# Patient Record
Sex: Female | Born: 1950 | Race: White | Hispanic: No | Marital: Married | Smoking: Former smoker
Health system: Southern US, Community
[De-identification: ages and names within clinical notes are randomized; demographics above are authoritative.]

## PROBLEM LIST (undated history)

## (undated) DIAGNOSIS — I493 Ventricular premature depolarization: Secondary | ICD-10-CM

## (undated) DIAGNOSIS — E785 Hyperlipidemia, unspecified: Secondary | ICD-10-CM

## (undated) DIAGNOSIS — E041 Nontoxic single thyroid nodule: Secondary | ICD-10-CM

## (undated) DIAGNOSIS — K649 Unspecified hemorrhoids: Secondary | ICD-10-CM

## (undated) DIAGNOSIS — I10 Essential (primary) hypertension: Secondary | ICD-10-CM

## (undated) DIAGNOSIS — Z8719 Personal history of other diseases of the digestive system: Secondary | ICD-10-CM

## (undated) DIAGNOSIS — C801 Malignant (primary) neoplasm, unspecified: Secondary | ICD-10-CM

## (undated) DIAGNOSIS — M8580 Other specified disorders of bone density and structure, unspecified site: Secondary | ICD-10-CM

## (undated) DIAGNOSIS — K219 Gastro-esophageal reflux disease without esophagitis: Secondary | ICD-10-CM

## (undated) HISTORY — PX: DILATION AND CURETTAGE OF UTERUS: SHX78

## (undated) HISTORY — PX: CERVICAL BIOPSY: SHX590

## (undated) HISTORY — PX: ESOPHAGOGASTRODUODENOSCOPY: SHX1529

## (undated) HISTORY — PX: SKIN CANCER EXCISION: SHX779

## (undated) HISTORY — PX: COLONOSCOPY: SHX174

## (undated) HISTORY — PX: TONSILLECTOMY: SUR1361

## (undated) HISTORY — PX: ABDOMINAL HYSTERECTOMY: SHX81

---

## 2004-05-31 ENCOUNTER — Ambulatory Visit: Payer: Self-pay | Admitting: Gastroenterology

## 2005-08-30 ENCOUNTER — Other Ambulatory Visit: Payer: Self-pay

## 2005-08-30 ENCOUNTER — Inpatient Hospital Stay: Payer: Self-pay | Admitting: Internal Medicine

## 2005-08-31 ENCOUNTER — Other Ambulatory Visit: Payer: Self-pay

## 2005-09-24 ENCOUNTER — Ambulatory Visit: Payer: Self-pay | Admitting: Gastroenterology

## 2005-09-26 ENCOUNTER — Ambulatory Visit: Payer: Self-pay | Admitting: Gastroenterology

## 2006-04-28 ENCOUNTER — Other Ambulatory Visit: Payer: Self-pay

## 2006-04-28 ENCOUNTER — Emergency Department: Payer: Self-pay | Admitting: Emergency Medicine

## 2007-04-30 ENCOUNTER — Ambulatory Visit: Payer: Self-pay | Admitting: Family Medicine

## 2008-07-23 ENCOUNTER — Ambulatory Visit: Payer: Self-pay | Admitting: Family Medicine

## 2008-09-08 ENCOUNTER — Ambulatory Visit: Payer: Self-pay | Admitting: Unknown Physician Specialty

## 2011-04-25 ENCOUNTER — Ambulatory Visit: Payer: Self-pay | Admitting: Gastroenterology

## 2011-05-26 ENCOUNTER — Ambulatory Visit: Payer: Self-pay | Admitting: Gastroenterology

## 2011-06-15 ENCOUNTER — Ambulatory Visit: Payer: Self-pay

## 2011-07-27 ENCOUNTER — Ambulatory Visit: Payer: Self-pay | Admitting: Internal Medicine

## 2011-07-27 LAB — RAPID INFLUENZA A&B ANTIGENS

## 2013-10-08 ENCOUNTER — Emergency Department: Payer: Self-pay | Admitting: Internal Medicine

## 2013-10-08 LAB — CBC
HCT: 45.5 % (ref 35.0–47.0)
HGB: 15.5 g/dL (ref 12.0–16.0)
MCH: 31.2 pg (ref 26.0–34.0)
MCHC: 34.1 g/dL (ref 32.0–36.0)
MCV: 92 fL (ref 80–100)
PLATELETS: 255 10*3/uL (ref 150–440)
RBC: 4.97 10*6/uL (ref 3.80–5.20)
RDW: 13.5 % (ref 11.5–14.5)
WBC: 7.1 10*3/uL (ref 3.6–11.0)

## 2013-10-08 LAB — COMPREHENSIVE METABOLIC PANEL
ALK PHOS: 97 U/L
ALT: 24 U/L (ref 12–78)
ANION GAP: 4 — AB (ref 7–16)
AST: 23 U/L (ref 15–37)
Albumin: 4.2 g/dL (ref 3.4–5.0)
BUN: 15 mg/dL (ref 7–18)
Bilirubin,Total: 0.4 mg/dL (ref 0.2–1.0)
CALCIUM: 9.2 mg/dL (ref 8.5–10.1)
Chloride: 107 mmol/L (ref 98–107)
Co2: 27 mmol/L (ref 21–32)
Creatinine: 0.75 mg/dL (ref 0.60–1.30)
EGFR (Non-African Amer.): >60
Glucose: 110 mg/dL — ABNORMAL HIGH (ref 65–99)
Osmolality: 277 (ref 275–301)
Potassium: 3.6 mmol/L (ref 3.5–5.1)
Sodium: 138 mmol/L (ref 136–145)
Total Protein: 8.3 g/dL — ABNORMAL HIGH (ref 6.4–8.2)

## 2013-10-08 LAB — URINALYSIS, COMPLETE
Bilirubin,UR: NEGATIVE
Glucose,UR: NEGATIVE mg/dL (ref 0–75)
Ketone: NEGATIVE
LEUKOCYTE ESTERASE: NEGATIVE
NITRITE: NEGATIVE
PROTEIN: NEGATIVE
Ph: 7 (ref 4.5–8.0)
RBC,UR: 1 /HPF (ref 0–5)
SPECIFIC GRAVITY: 1.002 (ref 1.003–1.030)

## 2013-10-08 LAB — MAGNESIUM: Magnesium: 1.9 mg/dL

## 2013-10-08 LAB — TROPONIN I

## 2013-10-08 LAB — TSH: Thyroid Stimulating Horm: 1.72 u[IU]/mL

## 2013-10-28 ENCOUNTER — Ambulatory Visit: Payer: Self-pay | Admitting: Gastroenterology

## 2016-11-22 ENCOUNTER — Ambulatory Visit
Admission: EM | Admit: 2016-11-22 | Discharge: 2016-11-22 | Disposition: A | Discharge status: Home / Self Care | Payer: Managed Care, Other (non HMO) | Attending: Family Medicine | Admitting: Family Medicine

## 2016-11-22 ENCOUNTER — Encounter: Payer: Self-pay | Admitting: Emergency Medicine

## 2016-11-22 DIAGNOSIS — J069 Acute upper respiratory infection, unspecified: Secondary | ICD-10-CM

## 2016-11-22 DIAGNOSIS — J029 Acute pharyngitis, unspecified: Secondary | ICD-10-CM | POA: Diagnosis not present

## 2016-11-22 DIAGNOSIS — J312 Chronic pharyngitis: Secondary | ICD-10-CM

## 2016-11-22 DIAGNOSIS — R0981 Nasal congestion: Secondary | ICD-10-CM

## 2016-11-22 HISTORY — DX: Essential (primary) hypertension: I10

## 2016-11-22 HISTORY — DX: Hyperlipidemia, unspecified: E78.5

## 2016-11-22 LAB — RAPID STREP SCREEN (MED CTR MEBANE ONLY): Streptococcus, Group A Screen (Direct): NEGATIVE

## 2016-11-22 NOTE — ED Provider Notes (Signed)
[facility]    CSN: [id number] Arrival date & time: 11/22/16  1405  History   Chief Complaint Chief Complaint  Patient presents with  . Sore Throat   HPI  66 year old female presents with complaints of ear pain, sore throat, and congestion.  Sore throat and ear pain started yesterday. Moderate in severity. Has prevented her from sleeping well. Congestion just started this morning. No associated fever. No known exacerbating or relieving factors. No medications tried. No other associated symptoms. No other complaints or concerns at this time.  Past Medical History:  Diagnosis Date  . Hyperlipidemia   . Hypertension    There are no active problems to display for this patient.  Past Surgical History:  Procedure Laterality Date  . ABDOMINAL HYSTERECTOMY    . TONSILLECTOMY     OB History    No data available     Home Medications    Prior to Admission medications   Medication Sig Start Date End Date Taking? Authorizing Provider  atorvastatin (LIPITOR) 10 MG tablet Take 10 mg by mouth daily.   Yes [provider]  diltiazem (CARDIZEM) 120 MG tablet Take 120 mg by mouth 4 (four) times daily.   Yes [provider]  lisinopril (PRINIVIL,ZESTRIL) 5 MG tablet Take 5 mg by mouth daily.   Yes [provider]    Family History History reviewed. No pertinent family history.  Social History Social History  Substance Use Topics  . Smoking status: Former Research scientist (life sciences)  . Smokeless tobacco: Never Used  . Alcohol use No   Allergies   Macrobid [nitrofurantoin macrocrystal] and Penicillins   Review of Systems Review of Systems  HENT: Positive for congestion, ear pain and sore throat.   All other systems reviewed and are negative.  Physical Exam Triage Vital Signs ED Triage Vitals  Enc Vitals Group     BP 11/22/16 1422 122/74     Pulse Rate 11/22/16 1422 68     Resp 11/22/16 1422 16     Temp 11/22/16 1422 98.6 F (37 C)     Temp Source  11/22/16 1422 Tympanic     SpO2 11/22/16 1422 99 %     Weight 11/22/16 1418 150 lb (68 kg)     Height 11/22/16 1418 5\' 1"  (1.549 m)     Head Circumference --      Peak Flow --      Pain Score 11/22/16 1418 4     Pain Loc --      Pain Edu? --      Excl. in [place]? --    Updated Vital Signs BP 122/74 (BP Location: Left Arm)   Pulse 68   Temp 98.6 F (37 C) (Tympanic)   Resp 16   Ht 5\' 1"  (1.549 m)   Wt 150 lb (68 kg)   SpO2 99%   BMI 28.34 kg/m   Physical Exam  Constitutional: She is oriented to person, place, and time. She appears well-developed. No distress.  HENT:  Head: Normocephalic and atraumatic.  Mouth/Throat: Oropharynx is clear and moist.  Normal TM's bilaterally.   Eyes: Conjunctivae are normal. Right eye exhibits no discharge. Left eye exhibits no discharge. No scleral icterus.  Neck: Neck supple.  Cardiovascular: Normal rate.   Regularly irregular. From ectopy.  Pulmonary/Chest: Effort normal and breath sounds normal.  Abdominal: Soft. She exhibits no distension.  Lymphadenopathy:    She has no cervical adenopathy.  Neurological: She is alert and oriented to person, place, and  time.  Skin: Skin is warm. No rash noted.  Psychiatric: She has a normal mood and affect.  Vitals reviewed.  UC Treatments / Results  Labs (all labs ordered are listed, but only abnormal results are displayed) Labs Reviewed  RAPID STREP SCREEN (NOT AT [facility])  CULTURE, GROUP A STREP [facility])    EKG  EKG Interpretation None       Radiology No results found.  Procedures Procedures (including critical care time)  Medications Ordered in UC Medications - No data to display   Initial Impression / Assessment and Plan / UC Course  I have reviewed the triage vital signs and the nursing notes.  Pertinent labs & imaging results that were available during my care of the patient were reviewed by me and considered in my medical decision making (see chart for details).    66 year old  female presenting with 1 day of sore throat, ear pain, congestion. Exam unremarkable. Rapid strep negative. Advised that this is likely viral. Supportive care.  Final Clinical Impressions(s) / UC Diagnoses   Final diagnoses:  Viral upper respiratory tract infection    New Prescriptions New Prescriptions   No medications on file     Coral Spikes, DO 11/22/16 1451

## 2016-11-22 NOTE — Discharge Instructions (Signed)
This is viral.  You can use OTC medications if you like.  Give it some time.  Take care  Dr. Lacinda Axon

## 2016-11-22 NOTE — ED Triage Notes (Signed)
Sore throat, ear ache, mild headache and congestion for 1 day

## 2016-11-25 LAB — CULTURE, GROUP A STREP (THRC)

## 2019-04-07 ENCOUNTER — Other Ambulatory Visit
Admission: RE | Admit: 2019-04-07 | Discharge: 2019-04-07 | Disposition: A | Discharge status: Home / Self Care | Payer: Medicare Other | Source: Ambulatory Visit | Attending: Gastroenterology | Admitting: Gastroenterology

## 2019-04-07 ENCOUNTER — Other Ambulatory Visit: Payer: Self-pay

## 2019-04-07 DIAGNOSIS — Z01812 Encounter for preprocedural laboratory examination: Secondary | ICD-10-CM | POA: Diagnosis present

## 2019-04-07 DIAGNOSIS — Z20828 Contact with and (suspected) exposure to other viral communicable diseases: Secondary | ICD-10-CM | POA: Insufficient documentation

## 2019-04-07 LAB — SARS CORONAVIRUS 2 (TAT 6-24 HRS): SARS Coronavirus 2: NEGATIVE

## 2019-04-08 ENCOUNTER — Other Ambulatory Visit: Payer: Medicare Other

## 2019-04-10 ENCOUNTER — Encounter: Payer: Self-pay | Admitting: Anesthesiology

## 2019-04-11 ENCOUNTER — Encounter
Admission: RE | Disposition: A | Discharge status: Home / Self Care | Payer: Self-pay | Source: Home / Self Care | Attending: Gastroenterology

## 2019-04-11 ENCOUNTER — Ambulatory Visit: Payer: Medicare Other | Admitting: Anesthesiology

## 2019-04-11 ENCOUNTER — Ambulatory Visit
Admission: RE | Admit: 2019-04-11 | Discharge: 2019-04-11 | Disposition: A | Discharge status: Home / Self Care | Payer: Medicare Other | Attending: Gastroenterology | Admitting: Gastroenterology

## 2019-04-11 ENCOUNTER — Other Ambulatory Visit: Payer: Self-pay

## 2019-04-11 ENCOUNTER — Encounter: Payer: Self-pay | Admitting: *Deleted

## 2019-04-11 DIAGNOSIS — Z79899 Other long term (current) drug therapy: Secondary | ICD-10-CM | POA: Insufficient documentation

## 2019-04-11 DIAGNOSIS — Z87891 Personal history of nicotine dependence: Secondary | ICD-10-CM | POA: Diagnosis not present

## 2019-04-11 DIAGNOSIS — M8580 Other specified disorders of bone density and structure, unspecified site: Secondary | ICD-10-CM | POA: Insufficient documentation

## 2019-04-11 DIAGNOSIS — K219 Gastro-esophageal reflux disease without esophagitis: Secondary | ICD-10-CM | POA: Diagnosis not present

## 2019-04-11 DIAGNOSIS — Z1211 Encounter for screening for malignant neoplasm of colon: Secondary | ICD-10-CM | POA: Insufficient documentation

## 2019-04-11 DIAGNOSIS — E785 Hyperlipidemia, unspecified: Secondary | ICD-10-CM | POA: Diagnosis not present

## 2019-04-11 DIAGNOSIS — Z7982 Long term (current) use of aspirin: Secondary | ICD-10-CM | POA: Diagnosis not present

## 2019-04-11 DIAGNOSIS — I10 Essential (primary) hypertension: Secondary | ICD-10-CM | POA: Diagnosis not present

## 2019-04-11 DIAGNOSIS — Z85828 Personal history of other malignant neoplasm of skin: Secondary | ICD-10-CM | POA: Diagnosis not present

## 2019-04-11 DIAGNOSIS — Z8371 Family history of colonic polyps: Secondary | ICD-10-CM | POA: Insufficient documentation

## 2019-04-11 HISTORY — DX: Unspecified hemorrhoids: K64.9

## 2019-04-11 HISTORY — DX: Nontoxic single thyroid nodule: E04.1

## 2019-04-11 HISTORY — DX: Malignant (primary) neoplasm, unspecified: C80.1

## 2019-04-11 HISTORY — DX: Ventricular premature depolarization: I49.3

## 2019-04-11 HISTORY — DX: Personal history of other diseases of the digestive system: Z87.19

## 2019-04-11 HISTORY — DX: Other specified disorders of bone density and structure, unspecified site: M85.80

## 2019-04-11 HISTORY — PX: COLONOSCOPY WITH PROPOFOL: SHX5780

## 2019-04-11 HISTORY — DX: Gastro-esophageal reflux disease without esophagitis: K21.9

## 2019-04-11 SURGERY — COLONOSCOPY WITH PROPOFOL
Anesthesia: General

## 2019-04-11 MED ORDER — FENTANYL CITRATE (PF) 100 MCG/2ML IJ SOLN
INTRAMUSCULAR | Status: DC | PRN
  Administered 2019-04-11 (×2): 50 ug via INTRAVENOUS

## 2019-04-11 MED ORDER — LIDOCAINE HCL (PF) 2 % IJ SOLN
INTRAMUSCULAR | Status: DC | PRN
  Administered 2019-04-11: 60 mg

## 2019-04-11 MED ORDER — MIDAZOLAM HCL 2 MG/2ML IJ SOLN
INTRAMUSCULAR | Status: AC
  Filled 2019-04-11: qty 2

## 2019-04-11 MED ORDER — MIDAZOLAM HCL 5 MG/5ML IJ SOLN
INTRAMUSCULAR | Status: DC | PRN
  Administered 2019-04-11: 2 mg via INTRAVENOUS

## 2019-04-11 MED ORDER — PROPOFOL 10 MG/ML IV BOLUS
INTRAVENOUS | Status: DC | PRN
  Administered 2019-04-11: 30 mg via INTRAVENOUS
  Administered 2019-04-11: 20 mg via INTRAVENOUS

## 2019-04-11 MED ORDER — LIDOCAINE HCL (PF) 2 % IJ SOLN
INTRAMUSCULAR | Status: AC
  Filled 2019-04-11: qty 10

## 2019-04-11 MED ORDER — PROPOFOL 500 MG/50ML IV EMUL
INTRAVENOUS | Status: DC | PRN
  Administered 2019-04-11: 50 ug/kg/min via INTRAVENOUS

## 2019-04-11 MED ORDER — FENTANYL CITRATE (PF) 100 MCG/2ML IJ SOLN
INTRAMUSCULAR | Status: AC
  Filled 2019-04-11: qty 2

## 2019-04-11 MED ORDER — SODIUM CHLORIDE 0.9 % IV SOLN
INTRAVENOUS | Status: DC
  Administered 2019-04-11: 09:00:00 via INTRAVENOUS

## 2019-04-11 NOTE — Transfer of Care (Signed)
Immediate Anesthesia Transfer of Care Note  Patient: Regina Lara  Procedure(s) Performed: COLONOSCOPY WITH PROPOFOL (N/A )  Patient Location: PACU  Anesthesia Type:General  Level of Consciousness: sedated  Airway & Oxygen Therapy: Patient Spontanous Breathing and Patient connected to nasal cannula oxygen  Post-op Assessment: Report given to RN and Post -op Vital signs reviewed and stable  Post vital signs: Reviewed and stable  Last Vitals:  Vitals Value Taken Time  BP    Temp    Pulse 55 04/11/19 1030  Resp 15 04/11/19 1030  SpO2 97 % 04/11/19 1030  Vitals shown include unvalidated device data.  Last Pain:  Vitals:   04/11/19 0857  TempSrc: Tympanic  PainSc: 0-No pain         Complications: No apparent anesthesia complications

## 2019-04-11 NOTE — Anesthesia Postprocedure Evaluation (Signed)
Anesthesia Post Note  Patient: Regina Lara  Procedure(s) Performed: COLONOSCOPY WITH PROPOFOL (N/A )  Patient location during evaluation: Endoscopy Anesthesia Type: General Level of consciousness: awake and alert Pain management: pain level controlled Vital Signs Assessment: post-procedure vital signs reviewed and stable Respiratory status: spontaneous breathing, nonlabored ventilation, respiratory function stable and patient connected to nasal cannula oxygen Cardiovascular status: blood pressure returned to baseline and stable Postop Assessment: no apparent nausea or vomiting Anesthetic complications: no     Last Vitals:  Vitals:   04/11/19 1030 04/11/19 1032  BP: (!) 88/41 (!) 99/46  Pulse:    Resp:    Temp: (!) 36.1 C   SpO2:      Last Pain:  Vitals:   04/11/19 1042  TempSrc:   PainSc: 0-No pain                 Joeangel Jeanpaul S

## 2019-04-11 NOTE — Op Note (Addendum)
[facility] Gastroenterology Patient Name: Regina Lara Procedure Date: 04/11/2019 9:53 AM MRN: [id number] Account #: [id number] Date of Birth: [birth year] Admit Type: Outpatient Age: 68 Room: [facility] ENDO ROOM 1 Gender: Female Note Status: Finalized Procedure:            Colonoscopy Indications:          Family history of colonic polyps in a first-degree                        relative Providers:            Lollie Sails, MD Medicines:            Monitored Anesthesia Care Complications:        No immediate complications. Procedure:            Pre-Anesthesia Assessment:                       - ASA Grade Assessment: III - A patient with severe                        systemic disease.                       After obtaining informed consent, the colonoscope was                        passed under direct vision. Throughout the procedure,                        the patient's blood pressure, pulse, and oxygen                        saturations were monitored continuously. The                        Colonoscope was introduced through the anus and                        advanced to the the terminal ileum. The colonoscopy was                        performed without difficulty. The patient tolerated the                        procedure well. The quality of the bowel preparation                        was good. The quality of the bowel preparation was good. Findings:      The colon (entire examined portion) appeared normal.      The retroflexed view of the distal rectum and anal verge was normal and       showed no anal or rectal abnormalities, note prominant anal pillars.      The digital rectal exam was normal.      The terminal ileum appeared normal. Impression:           - The entire examined colon is normal.                       - The distal rectum and anal verge are normal on  retroflexion view.                       - No specimens  collected. Recommendation:       - Repeat colonoscopy in 5 years for screening purposes.                       - Advance diet as tolerated. Procedure Code(s):    --- Professional ---                       [id number], Colonoscopy, flexible; diagnostic, including                        collection of specimen(s) by brushing or washing, when                        performed (separate procedure) Diagnosis Code(s):    --- Professional ---                       Z83.71, Family history of colonic polyps CPT copyright 2019 American Medical Association. All rights reserved. The codes documented in this report are preliminary and upon coder review may  be revised to meet current compliance requirements. Lollie Sails, MD 04/11/2019 10:30:26 AM This report has been signed electronically. Number of Addenda: 0 Note Initiated On: 04/11/2019 9:53 AM Scope Withdrawal Time: 0 hours 7 minutes 46 seconds  Total Procedure Duration: 0 hours 16 minutes 33 seconds       [facility]

## 2019-04-11 NOTE — Anesthesia Preprocedure Evaluation (Signed)
Anesthesia Evaluation  Patient identified by MRN, date of birth, ID band Patient awake    Reviewed: Allergy & Precautions, NPO status , Patient's Chart, lab work & pertinent test results, reviewed documented beta blocker date and time   Airway Mallampati: III  TM Distance: >3 FB     Dental  (+) Chipped   Pulmonary former smoker,           Cardiovascular hypertension, Pt. on medications      Neuro/Psych    GI/Hepatic hiatal hernia, GERD  ,  Endo/Other    Renal/GU      Musculoskeletal   Abdominal   Peds  Hematology   Anesthesia Other Findings Hx of PVCs.  Reproductive/Obstetrics                             Anesthesia Physical Anesthesia Plan  ASA: III  Anesthesia Plan: General   Post-op Pain Management:    Induction: Intravenous  PONV Risk Score and Plan:   Airway Management Planned:   Additional Equipment:   Intra-op Plan:   Post-operative Plan:   Informed Consent: I have reviewed the patients History and Physical, chart, labs and discussed the procedure including the risks, benefits and alternatives for the proposed anesthesia with the patient or authorized representative who has indicated his/her understanding and acceptance.       Plan Discussed with: CRNA  Anesthesia Plan Comments:         Anesthesia Quick Evaluation

## 2019-04-11 NOTE — Anesthesia Post-op Follow-up Note (Signed)
Anesthesia QCDR form completed.        

## 2019-04-11 NOTE — H&P (Signed)
Outpatient short stay form Pre-procedure 04/11/2019 10:02 AM Regina Sails MD  Primary Physician: Dr. Genene Churn  Reason for visit: Patient is a 68 year old female presenting today for colonoscopy in regards to family history of colon polyps in a primary relative, father.    History of present illness:  Patient's last colonoscopy was 10/28/2013- for polyps at that time.  He tolerated her prep well.  She takes a daily 81 mg aspirin that was held this morning.  She takes no other aspirin product or blood thinning agent.  Patient has no problems with diarrhea or rectal pain or abdominal pain or rectal bleeding.      Current Facility-Administered Medications:  .  0.9 %  sodium chloride infusion, , Intravenous, Continuous, Jonathon Bellows, MD, Last Rate: 20 mL/hr at 04/11/19 [id number]  Medications Prior to Admission  Medication Sig Dispense Refill Last Dose  . ASPIRIN 81 PO Take by mouth.   04/09/2019 at 2100  . diltiazem (CARDIZEM) 120 MG tablet Take 120 mg by mouth 4 (four) times daily.   04/10/2019 at 2100  . ergocalciferol (VITAMIN D2) 1.25 MG (50000 UT) capsule Take 50,000 Units by mouth every 14 (fourteen) days.   Past Week at Unknown time  . esomeprazole (NEXIUM) 20 MG capsule Take 20 mg by mouth daily as needed.   Past Month at Unknown time  . lisinopril (PRINIVIL,ZESTRIL) 5 MG tablet Take 5 mg by mouth daily.   04/11/2019 at 0600  . vitamin B-12 (CYANOCOBALAMIN) 500 MCG tablet Take 500 mcg by mouth daily.   Past Week at Unknown time  . atorvastatin (LIPITOR) 10 MG tablet Take 10 mg by mouth daily.   04/09/2019 at 2100     Allergies  Allergen Reactions  . Macrobid [Nitrofurantoin Macrocrystal]   . Melatonin   . Penicillins   . Clindamycin/Lincomycin Rash     Past Medical History:  Diagnosis Date  . Cancer ([place])    skin  . GERD (gastroesophageal reflux disease)   . Hemorrhoids   . History of hiatal hernia   . Hyperlipidemia   . Hypertension   . Osteopenia   . PVC (premature  ventricular contraction)   . Thyroid nodule    followed by Dr. Kathyrn Sheriff    Review of systems:      Physical Exam    Heart and lungs: Regular rate and rhythm without rub or gallop lungs are bilaterally clear    HEENT: Normocephalic atraumatic eyes are anicteric    Other:    Pertinant exam for procedure: Soft nontender nondistended bowel sounds positive normoactive    Planned proceedures: Colonoscopy and indicated procedures. I have discussed the risks benefits and complications of procedures to include not limited to bleeding, infection, perforation and the risk of sedation and the patient wishes to proceed.    Regina Sails, MD Gastroenterology 04/11/2019  10:02 AM

## 2019-04-12 ENCOUNTER — Encounter: Payer: Self-pay | Admitting: Gastroenterology

## 2019-12-03 ENCOUNTER — Emergency Department
Admission: EM | Admit: 2019-12-03 | Discharge: 2019-12-03 | Disposition: A | Discharge status: Home / Self Care | Payer: Medicare Other | Attending: Emergency Medicine | Admitting: Emergency Medicine

## 2019-12-03 ENCOUNTER — Encounter: Payer: Self-pay | Admitting: Emergency Medicine

## 2019-12-03 ENCOUNTER — Other Ambulatory Visit: Payer: Self-pay

## 2019-12-03 DIAGNOSIS — Z79899 Other long term (current) drug therapy: Secondary | ICD-10-CM | POA: Diagnosis not present

## 2019-12-03 DIAGNOSIS — Z7982 Long term (current) use of aspirin: Secondary | ICD-10-CM | POA: Insufficient documentation

## 2019-12-03 DIAGNOSIS — R10814 Left lower quadrant abdominal tenderness: Secondary | ICD-10-CM | POA: Insufficient documentation

## 2019-12-03 DIAGNOSIS — Z87891 Personal history of nicotine dependence: Secondary | ICD-10-CM | POA: Diagnosis not present

## 2019-12-03 DIAGNOSIS — K625 Hemorrhage of anus and rectum: Secondary | ICD-10-CM

## 2019-12-03 LAB — APTT: aPTT: 28 seconds (ref 24–36)

## 2019-12-03 LAB — COMPREHENSIVE METABOLIC PANEL
ALT: 26 U/L (ref 0–44)
AST: 23 U/L (ref 15–41)
Albumin: 4.2 g/dL (ref 3.5–5.0)
Alkaline Phosphatase: 110 U/L (ref 38–126)
Anion gap: 9 (ref 5–15)
BUN: 14 mg/dL (ref 8–23)
CO2: 27 mmol/L (ref 22–32)
Calcium: 9.2 mg/dL (ref 8.9–10.3)
Chloride: 104 mmol/L (ref 98–111)
Creatinine, Ser: 0.75 mg/dL (ref 0.44–1.00)
GFR calc Af Amer: >60 mL/min (ref >60)
GFR calc non Af Amer: >60 mL/min (ref >60)
Glucose, Bld: 112 mg/dL — ABNORMAL HIGH (ref 70–99)
Potassium: 3.9 mmol/L (ref 3.5–5.1)
Sodium: 140 mmol/L (ref 135–145)
Total Bilirubin: 1 mg/dL (ref 0.3–1.2)
Total Protein: 8 g/dL (ref 6.5–8.1)

## 2019-12-03 LAB — TYPE AND SCREEN
ABO/RH(D): A NEG
Antibody Screen: NEGATIVE

## 2019-12-03 LAB — CBC
HCT: 41.1 % (ref 36.0–46.0)
Hemoglobin: 14.2 g/dL (ref 12.0–15.0)
MCH: 30.7 pg (ref 26.0–34.0)
MCHC: 34.5 g/dL (ref 30.0–36.0)
MCV: 89 fL (ref 80.0–100.0)
Platelets: 297 10*3/uL (ref 150–400)
RBC: 4.62 MIL/uL (ref 3.87–5.11)
RDW: 12.4 % (ref 11.5–15.5)
WBC: 7.6 10*3/uL (ref 4.0–10.5)
nRBC: 0 % (ref 0.0–0.2)

## 2019-12-03 LAB — URINALYSIS, COMPLETE (UACMP) WITH MICROSCOPIC
Bilirubin Urine: NEGATIVE
Glucose, UA: NEGATIVE mg/dL
Ketones, ur: 20 mg/dL — AB
Leukocytes,Ua: NEGATIVE
Nitrite: NEGATIVE
Protein, ur: NEGATIVE mg/dL
Specific Gravity, Urine: 1.025 (ref 1.005–1.030)
pH: 5 (ref 5.0–8.0)

## 2019-12-03 LAB — LIPASE, BLOOD: Lipase: 30 U/L (ref 11–51)

## 2019-12-03 LAB — PROTIME-INR
INR: 1 (ref 0.8–1.2)
Prothrombin Time: 12.8 seconds (ref 11.4–15.2)

## 2019-12-03 LAB — HEMOGLOBIN AND HEMATOCRIT, BLOOD
HCT: 39 % (ref 36.0–46.0)
Hemoglobin: 13.4 g/dL (ref 12.0–15.0)

## 2019-12-03 MED ORDER — SODIUM CHLORIDE 0.9% FLUSH: 3.0000 mL | Freq: Once | INTRAVENOUS | Status: DC

## 2019-12-03 NOTE — ED Notes (Signed)
See triage note. Pt states she has frank blood and blood clots in stool when she defecates and reports mild diarhea. Pt denies pain, denies dizziness/CP, denies n/v.  Pt is AOX4, ambulatory. NAD noted. Skin is warm and pink.

## 2019-12-03 NOTE — ED Triage Notes (Addendum)
FIRST NURSE NOTE:  Pt here from [facility] via wheelchair, with reports of rectal bleeding, pt ate nuts recently has hx of diverticulitis.   No distress noted on arrival. Mask in place.

## 2019-12-03 NOTE — ED Provider Notes (Signed)
[facility] Emergency Department Provider Note  ____________________________________________   First MD Initiated Contact with Patient 12/03/19 1352     (approximate)  I have reviewed the triage vital signs and the nursing notes.   HISTORY  Chief Complaint Rectal Bleeding and Abdominal Pain    HPI Regina Lara is a 69 y.o. female with prior colitis who comes in with concerns for rectal bleeding.  Patient states that she ate a nut in the next day she had some left lower quadrant tenderness and then the tenderness resolved but she started to have some bleeding from her rectum.  Patient stated the bleeding started yesterday around 7 PM.  She has had 3 bloody bowel movements since then.  The last being earlier this morning.  No recent bowel movement in the past 4 to 5 hours of being in the ER.  States she denies any lightheadedness, weakness.  Patient has had a prior history in 2012 of colitis secondary to eating and not.  That time she also had bleeding.  She never required a blood transfusion and the bleeding stopped on its own.  She followed up with GI and had a colonoscopy done about a year ago that was reassuring at that time.  Patient states that she is not having any abdominal tenderness at this time.          Past Medical History:  Diagnosis Date  . Cancer ([place])    skin  . GERD (gastroesophageal reflux disease)   . Hemorrhoids   . History of hiatal hernia   . Hyperlipidemia   . Hypertension   . Osteopenia   . PVC (premature ventricular contraction)   . Thyroid nodule    followed by Dr. Kathyrn Sheriff    There are no problems to display for this patient.   Past Surgical History:  Procedure Laterality Date  . ABDOMINAL HYSTERECTOMY    . CERVICAL BIOPSY  1980's  . COLONOSCOPY  08/29/2008, 10/28/2013  . COLONOSCOPY WITH PROPOFOL N/A 04/11/2019   Procedure: COLONOSCOPY WITH PROPOFOL;  Surgeon: Lollie Sails, MD;  Location: [facility] ENDOSCOPY;   Service: Endoscopy;  Laterality: N/A;  . DILATION AND CURETTAGE OF UTERUS    . ESOPHAGOGASTRODUODENOSCOPY    . SKIN CANCER EXCISION    . TONSILLECTOMY      Prior to Admission medications   Medication Sig Start Date End Date Taking? Authorizing Provider  ASPIRIN 81 PO Take by mouth.    [provider]  atorvastatin (LIPITOR) 10 MG tablet Take 10 mg by mouth daily.    [provider]  diltiazem (CARDIZEM) 120 MG tablet Take 120 mg by mouth 4 (four) times daily.    [provider]  ergocalciferol (VITAMIN D2) 1.25 MG (50000 UT) capsule Take 50,000 Units by mouth every 14 (fourteen) days.    [provider]  esomeprazole (NEXIUM) 20 MG capsule Take 20 mg by mouth daily as needed.    [provider]  lisinopril (PRINIVIL,ZESTRIL) 5 MG tablet Take 5 mg by mouth daily.    [provider]  vitamin B-12 (CYANOCOBALAMIN) 500 MCG tablet Take 500 mcg by mouth daily.    [provider]    Allergies Macrobid [nitrofurantoin macrocrystal], Melatonin, Penicillins, and Clindamycin/lincomycin  No family history on file.  Social History Social History   Tobacco Use  . Smoking status: Former Smoker    Quit date: 06/23/2005    Years since quitting: 14.4  . Smokeless tobacco: Never Used  Substance Use  Topics  . Alcohol use: No  . Drug use: No      Review of Systems Constitutional: No fever/chills Eyes: No visual changes. ENT: No sore throat. Cardiovascular: Denies chest pain. Respiratory: Denies shortness of breath. Gastrointestinal: Abdominal pain now resolved.  No nausea, no vomiting.  No diarrhea.  No constipation.  Rectal bleeding Genitourinary: Negative for dysuria. Musculoskeletal: Negative for back pain. Skin: Negative for rash. Neurological: Negative for headaches, focal weakness or numbness. All other ROS negative ____________________________________________   PHYSICAL EXAM:  VITAL SIGNS: ED Triage Vitals  Enc  Vitals Group     BP 12/03/19 1025 (!) 149/69     Pulse Rate 12/03/19 1025 75     Resp 12/03/19 1025 15     Temp 12/03/19 1025 98.5 F (36.9 C)     Temp Source 12/03/19 1025 Oral     SpO2 12/03/19 1025 100 %     Weight 12/03/19 1035 150 lb 1.6 oz (68.1 kg)     Height 12/03/19 1035 5\' 1"  (1.549 m)     Head Circumference --      Peak Flow --      Pain Score 12/03/19 1034 4     Pain Loc --      Pain Edu? --      Excl. in [place]? --     Constitutional: Alert and oriented. Well appearing and in no acute distress. Eyes: Conjunctivae are normal. EOMI. Head: Atraumatic. Nose: No congestion/rhinnorhea. Mouth/Throat: Mucous membranes are moist.   Neck: No stridor. Trachea Midline. FROM Cardiovascular: Normal rate, regular rhythm. Grossly normal heart sounds.  Good peripheral circulation. Respiratory: Normal respiratory effort.  No retractions. Lungs CTAB. Gastrointestinal: Soft and nontender. No distention. No abdominal bruits.  Musculoskeletal: No lower extremity tenderness nor edema.  No joint effusions. Neurologic:  Normal speech and language. No gross focal neurologic deficits are appreciated.  Skin:  Skin is warm, dry and intact. No rash noted. Psychiatric: Mood and affect are normal. Speech and behavior are normal. GU: No external hemorrhoids noted, positive small amount of bright red blood per rectum  ____________________________________________   LABS (all labs ordered are listed, but only abnormal results are displayed)  Labs Reviewed  COMPREHENSIVE METABOLIC PANEL - Abnormal; Notable for the following components:      Result Value   Glucose, Bld 112 (*)    All other components within normal limits  URINALYSIS, COMPLETE (UACMP) WITH MICROSCOPIC - Abnormal; Notable for the following components:   Color, Urine YELLOW (*)    APPearance CLOUDY (*)    Hgb urine dipstick SMALL (*)    Ketones, ur 20 (*)    Bacteria, UA RARE (*)    All other components within normal limits    LIPASE, BLOOD  CBC  APTT  PROTIME-INR  HEMOGLOBIN AND HEMATOCRIT, BLOOD  TYPE AND SCREEN   ____________________________________________   PROCEDURES  Procedure(s) performed (including Critical Care):  Procedures   ____________________________________________   INITIAL IMPRESSION / ASSESSMENT AND PLAN / ED COURSE  Regina Lara was evaluated in Emergency Department on 12/03/2019 for the symptoms described in the history of present illness. She was evaluated in the context of the global COVID-19 pandemic, which necessitated consideration that the patient might be at risk for infection with the SARS-CoV-2 virus that causes COVID-19. Institutional protocols and algorithms that pertain to the evaluation of patients at risk for COVID-19 are in a state of rapid change based on information released by regulatory bodies including the CDC and federal and  state organizations. These policies and algorithms were followed during the patient's care in the ED.    Patient is a well-appearing 69 year old who comes in with painless rectal bleeding.  Patient is had this previously and it resolved did not require any blood transfusions.  Labs to evaluate for anemia.  Low suspicion for ischemic colitis given no pain on proportion, no history of atrial fibrillation.  Possibly secondary to colitis versus diverticulum flareup from eating the nut versus internal hemorrhoids.  No abdominal tenderness at this time so do not think we need to do CT imaging.  Hemoglobin stable at 14.2.    Glasgow-blatchford score is zero.   Discussed with patient that we could admit patient for serial hemoglobins and inpatient GI consultation versus patient going home and following up outpatient.  Patient states that she would prefer to go home if possible.  Patient is been in the ER for greater than 6 hours without recurrent bloody bowel movement.  She has a stable hemoglobin trended over 4 hours.  She remains well-appearing  without any tachycardia, hypotension.  She is not on any blood thinners.  I discussed with patient that she could potentially go home if she was agreeable to following up for repeat hemoglobin check on Monday and following up with GI outpatient.  Patient expressed understanding.  She understands though that if the large bloody bowel movements are reoccurring prior to then that she can always come back to the ER tomorrow for hemoglobin recheck.  Patient feels comfortable with this plan.  At this time I do not think she will need any antibiotics to cover for possible colitis given the fact that she has no abdominal tenderness at this time.     ____________________________________________   FINAL CLINICAL IMPRESSION(S) / ED DIAGNOSES   Final diagnoses:  Rectal bleeding      MEDICATIONS GIVEN DURING THIS VISIT:  Medications - No data to display   ED Discharge Orders    None       Note:  This document was prepared using Dragon voice recognition software and may include unintentional dictation errors.   Vanessa Whitney, MD 12/04/19 [phone]

## 2019-12-03 NOTE — ED Notes (Signed)
Pt instructed on process for UA, pt verbalizes understanding and is attempting to provide sample.

## 2019-12-03 NOTE — Discharge Instructions (Addendum)
Need hemoglobin recheck in 2 days on Monday with your primary doctor.  Call GI to schedule follow up appointment  Return to ER immediately if worsening bleeding, lightheadness or other concerns.

## 2019-12-03 NOTE — ED Triage Notes (Signed)
Pt presents to ED via wheelchair from [facility]. Pt states yesterday had LLQ abdominal pain after eating pistachios and cashews and hx of diverticulitis. Pt states some diarrhea, with noted blood in stools, then had 3 episodes of large amounts of blood throughout the night. Pt states this AM noted some blood clots in the toilet.

## 2019-12-03 NOTE — ED Notes (Signed)
Lab notified to add on PTT and PT-INR, spoke with Gwen.

## 2019-12-03 NOTE — ED Notes (Signed)
Rounded on patient at this time, pt reports she is feeling better, Advised patient we are waiting for room in ED to open up. Pt ambulatory to the bathroom.

## 2019-12-05 ENCOUNTER — Telehealth: Payer: Self-pay | Admitting: Gastroenterology

## 2019-12-05 NOTE — Telephone Encounter (Signed)
Regina Manifold, MD  Holley Raring can we get this pt scheduled to see any provider in clinic for ER follow up   Thanks        Spoke to [place] and she does not want to schedule an appt as she is established with another GI doctor.

## 2020-09-04 ENCOUNTER — Ambulatory Visit (INDEPENDENT_AMBULATORY_CARE_PROVIDER_SITE_OTHER): Payer: Medicare Other | Admitting: Urology

## 2020-09-04 ENCOUNTER — Encounter: Payer: Self-pay | Admitting: Urology

## 2020-09-04 ENCOUNTER — Other Ambulatory Visit: Payer: Self-pay

## 2020-09-04 ENCOUNTER — Inpatient Hospital Stay: Admit: 2020-09-04 | Discharge: 2020-09-04 | Disposition: A | Discharge status: Home / Self Care | Payer: Medicare Other

## 2020-09-04 VITALS — BP 180/90 | HR 76 | Ht 61.0 in | Wt 153.0 lb

## 2020-09-04 DIAGNOSIS — R310 Gross hematuria: Secondary | ICD-10-CM

## 2020-09-04 DIAGNOSIS — N390 Urinary tract infection, site not specified: Secondary | ICD-10-CM | POA: Diagnosis not present

## 2020-09-04 DIAGNOSIS — R3121 Asymptomatic microscopic hematuria: Secondary | ICD-10-CM | POA: Diagnosis not present

## 2020-09-04 LAB — URINALYSIS, COMPLETE (UACMP) WITH MICROSCOPIC
Bilirubin Urine: NEGATIVE
Glucose, UA: NEGATIVE mg/dL
Ketones, ur: NEGATIVE mg/dL
Nitrite: NEGATIVE
Protein, ur: NEGATIVE mg/dL
Specific Gravity, Urine: >1.03 — ABNORMAL HIGH (ref 1.005–1.030)
pH: 5.5 (ref 5.0–8.0)

## 2020-09-04 MED ORDER — DIAZEPAM 5 MG PO TABS
5.0000 mg | ORAL_TABLET | Freq: Once | ORAL | 0 refills | Status: AC | PRN
Start: 2020-09-04 — End: ?

## 2020-09-04 NOTE — Patient Instructions (Signed)
Cystoscopy Cystoscopy is a procedure that is used to help diagnose and sometimes treat conditions that affect the lower urinary tract. The lower urinary tract includes the bladder and the urethra. The urethra is the tube that drains urine from the bladder. Cystoscopy is done using a thin, tube-shaped instrument with a light and camera at the end (cystoscope). The cystoscope may be hard or flexible, depending on the goal of the procedure. The cystoscope is inserted through the urethra, into the bladder. Cystoscopy may be recommended if you have:  Urinary tract infections that keep coming back.  Blood in the urine (hematuria).  An inability to control when you urinate (urinary incontinence) or an overactive bladder.  Unusual cells found in a urine sample.  A blockage in the urethra, such as a urinary stone.  Painful urination.  An abnormality in the bladder found during an intravenous pyelogram (IVP) or CT scan. Cystoscopy may also be done to remove a sample of tissue to be examined under a microscope (biopsy). Tell a health care provider about:  Any allergies you have.  All medicines you are taking, including vitamins, herbs, eye drops, creams, and over-the-counter medicines.  Any problems you or family members have had with anesthetic medicines.  Any blood disorders you have.  Any surgeries you have had.  Any medical conditions you have.  Whether you are pregnant or may be pregnant. What are the risks? Generally, this is a safe procedure. However, problems may occur, including:  Infection.  Bleeding.  Allergic reactions to medicines.  Damage to other structures or organs. What happens before the procedure? Medicines Ask your health care provider about:  Changing or stopping your regular medicines. This is especially important if you are taking diabetes medicines or blood thinners.  Taking medicines such as aspirin and ibuprofen. These medicines can thin your blood. Do  not take these medicines unless your health care provider tells you to take them.  Taking over-the-counter medicines, vitamins, herbs, and supplements. Tests You may have an exam or testing, such as:  X-rays of the bladder, urethra, or kidneys.  CT scan of the abdomen or pelvis.  Urine tests to check for signs of infection. General instructions  Follow instructions from your health care provider about eating or drinking restrictions.  Ask your health care provider what steps will be taken to help prevent infection. These steps may include: ? Washing skin with a germ-killing soap. ? Taking antibiotic medicine.  Plan to have a responsible adult take you home from the hospital or clinic. What happens during the procedure?  You will be given one or more of the following: ? A medicine to help you relax (sedative). ? A medicine to numb the area (local anesthetic).  The area around the opening of your urethra will be cleaned.  The cystoscope will be passed through your urethra into your bladder.  Germ-free (sterile) fluid will flow through the cystoscope to fill your bladder. The fluid will stretch your bladder so that your health care provider can clearly examine your bladder walls.  Your doctor will look at the urethra and bladder. Your doctor may take a biopsy or remove stones.  The cystoscope will be removed, and your bladder will be emptied. The procedure may vary among health care providers and hospitals.   What can I expect after the procedure? After the procedure, it is common to have:  Some soreness or pain in your abdomen and urethra.  Urinary symptoms. These include: ? Mild pain or burning  when you urinate. Pain should stop within a few minutes after you urinate. This may last for up to 1 week. ? A small amount of blood in your urine for several days. ? Feeling like you need to urinate but producing only a small amount of urine. Follow these instructions at  home: Medicines  Take over-the-counter and prescription medicines only as told by your health care provider.  If you were prescribed an antibiotic medicine, take it as told by your health care provider. Do not stop taking the antibiotic even if you start to feel better. General instructions  Return to your normal activities as told by your health care provider. Ask your health care provider what activities are safe for you.  If you were given a sedative during the procedure, it can affect you for several hours. Do not drive or operate machinery until your health care provider says that it is safe.  Watch for any blood in your urine. If the amount of blood in your urine increases, call your health care provider.  Follow instructions from your health care provider about eating or drinking restrictions.  If a tissue sample was removed for testing (biopsy) during your procedure, it is up to you to get your test results. Ask your health care provider, or the department that is doing the test, when your results will be ready.  Drink enough fluid to keep your urine pale yellow.  Keep all follow-up visits. This is important. Contact a health care provider if:  You have pain that gets worse or does not get better with medicine, especially pain when you urinate.  You have trouble urinating.  You have more blood in your urine. Get help right away if:  You have blood clots in your urine.  You have abdominal pain.  You have a fever or chills.  You are unable to urinate. Summary  Cystoscopy is a procedure that is used to help diagnose and sometimes treat conditions that affect the lower urinary tract.  Cystoscopy is done using a thin, tube-shaped instrument with a light and camera at the end.  After the procedure, it is common to have some soreness or pain in your abdomen and urethra.  Watch for any blood in your urine. If the amount of blood in your urine increases, call your health  care provider.  If you were prescribed an antibiotic medicine, take it as told by your health care provider. Do not stop taking the antibiotic even if you start to feel better. This information is not intended to replace advice given to you by your health care provider. Make sure you discuss any questions you have with your health care provider. Document Revised: 02/17/2020 Document Reviewed: 02/17/2020 Elsevier Patient Education  [place].

## 2020-09-04 NOTE — Progress Notes (Signed)
   09/04/20 3:11 PM   Edgardo Roys Jun 24, 1951 [id number]  CC: Gross hematuria, recurrent UTIs  HPI: I saw Ms. Bascomb in urology clinic today for the above issues.  She is a 70 year old female with anxiety and history of distant hysterectomy for cervical cancer who presents with 2 weeks of pelvic pain and pressure intermittently, as well as gross hematuria.  She brought a number of jars in the clinic today with various shades of red urine.  She also thinks she has passed some debris and possibly stone material.  She has a number of culture documented UTIs over the last 4 months.  Her urinary symptoms improved with antibiotics, but she continued to have gross hematuria over the last week.  The hematuria is clearing over the last few days.  She denies any prior history of gross hematuria or recurrent UTIs prior to these episodes.  She denies any history of kidney stones.  She has a 15-pack-year smoking history and quit in 2006.  No recent cross-sectional imaging to review.  Urinalysis today with persistent microscopic hematuria with 6-10 RBCs, 0-5 WBCs, rare bacteria.  PMH: Past Medical History:  Diagnosis Date  . Cancer ([place])    skin  . GERD (gastroesophageal reflux disease)   . Hemorrhoids   . History of hiatal hernia   . Hyperlipidemia   . Hypertension   . Osteopenia   . PVC (premature ventricular contraction)   . Thyroid nodule    followed by Dr. Kathyrn Sheriff    Surgical History: Past Surgical History:  Procedure Laterality Date  . ABDOMINAL HYSTERECTOMY    . CERVICAL BIOPSY  1980's  . COLONOSCOPY  08/29/2008, 10/28/2013  . COLONOSCOPY WITH PROPOFOL N/A 04/11/2019   Procedure: COLONOSCOPY WITH PROPOFOL;  Surgeon: Lollie Sails, MD;  Location: [facility] ENDOSCOPY;  Service: Endoscopy;  Laterality: N/A;  . DILATION AND CURETTAGE OF UTERUS    . ESOPHAGOGASTRODUODENOSCOPY    . SKIN CANCER EXCISION    . TONSILLECTOMY      Social History:  reports that she quit smoking about 15  years ago. She has never used smokeless tobacco. She reports that she does not drink alcohol and does not use drugs.  Physical Exam: BP (!) 180/90   Pulse 76   Ht 5\' 1"  (1.549 m)   Wt 153 lb (69.4 kg)   BMI 28.91 kg/m    Constitutional:  Alert and oriented, No acute distress. Anxious Cardiovascular: No clubbing, cyanosis, or edema. Respiratory: Normal respiratory effort, no increased work of breathing. GI: Abdomen is soft, nontender, nondistended, no abdominal masses   Laboratory Data: Reviewed, see HPI  Pertinent Imaging: None to review  Assessment & Plan:   70 year old female with history of hysterectomy for cervical cancer in the 80s who reports 3 to 4 months of recurrence of culture documented Klebsiella UTIs, with persistent asymptomatic gross hematuria despite no evidence of infection on culture, and persistent microscopic hematuria today.  We discussed common possible etiologies of hematuria including malignancy, urolithiasis, medical renal disease, and idiopathic. Standard workup recommended by the AUA includes imaging with CT urogram to assess the upper tracts, and cystoscopy. Cytology is performed on patient's with gross hematuria to look for malignant cells in the urine.  -CT urogram and cystoscopy for persistent hematuria despite clearance of infection -If work-up negative, consider topical estrogen cream and other rUTI strategies  Nickolas Madrid, MD 09/04/2020  [place] [address] [place], [place] [address] [phone]

## 2020-09-05 ENCOUNTER — Telehealth: Payer: Self-pay

## 2020-09-05 NOTE — Telephone Encounter (Signed)
Called pt informed her of the information below. Pt gave verbal understanding.  

## 2020-09-05 NOTE — Telephone Encounter (Signed)
-----   Message from Billey Co, MD sent at 09/05/2020  8:12 AM EST ----- No infection on UA, but persistent microscopic blood. Keep follow up for CT and cysto as scheduled  Thanks Nickolas Madrid, MD 09/05/2020

## 2020-09-13 ENCOUNTER — Ambulatory Visit
Admission: RE | Admit: 2020-09-13 | Discharge: 2020-09-13 | Disposition: A | Discharge status: Home / Self Care | Payer: Medicare Other | Source: Ambulatory Visit | Attending: Urology | Admitting: Urology

## 2020-09-13 ENCOUNTER — Other Ambulatory Visit: Payer: Self-pay

## 2020-09-13 DIAGNOSIS — R310 Gross hematuria: Secondary | ICD-10-CM | POA: Diagnosis present

## 2020-09-13 LAB — POCT I-STAT CREATININE: Creatinine, Ser: 0.7 mg/dL (ref 0.44–1.00)

## 2020-09-13 MED ORDER — IOHEXOL 300 MG/ML  SOLN
150.0000 mL | Freq: Once | INTRAMUSCULAR | Status: AC | PRN
  Administered 2020-09-13: 125 mL via INTRAVENOUS

## 2020-09-26 ENCOUNTER — Ambulatory Visit (INDEPENDENT_AMBULATORY_CARE_PROVIDER_SITE_OTHER): Payer: Medicare Other | Admitting: Urology

## 2020-09-26 ENCOUNTER — Encounter: Payer: Self-pay | Admitting: Urology

## 2020-09-26 ENCOUNTER — Other Ambulatory Visit: Payer: Self-pay

## 2020-09-26 VITALS — BP 139/60 | HR 60 | Ht 61.0 in | Wt 150.0 lb

## 2020-09-26 DIAGNOSIS — R310 Gross hematuria: Secondary | ICD-10-CM

## 2020-09-26 DIAGNOSIS — N390 Urinary tract infection, site not specified: Secondary | ICD-10-CM

## 2020-09-26 MED ORDER — ESTROGENS, CONJUGATED 0.625 MG/GM VA CREA: TOPICAL_CREAM | VAGINAL | 1 refills | Status: DC

## 2020-09-26 MED ORDER — LIDOCAINE HCL URETHRAL/MUCOSAL 2 % EX GEL
1.0000 "application " | Freq: Once | CUTANEOUS | Status: AC
  Administered 2020-09-26: 1 via URETHRAL

## 2020-09-26 MED ORDER — ESTROGENS, CONJUGATED 0.625 MG/GM VA CREA
TOPICAL_CREAM | VAGINAL | 11 refills | Status: AC
Start: 2020-09-26 — End: ?

## 2020-09-26 NOTE — Progress Notes (Signed)
Cystoscopy Procedure Note:  Indication: Gross hematuria  After informed consent and discussion of the procedure and its risks, SAHARA FUJIMOTO was positioned and prepped in the standard fashion. Cystoscopy was performed with a flexible cystoscope. The urethra, bladder neck and entire bladder was visualized in a standard fashion. The ureteral orifices were visualized in their normal location and orientation.  Bladder mucosa was grossly normal throughout.  No abnormalities on retroflexion.  Cytology sent.  Imaging: I personally reviewed the CT urogram, left lower pole calyceal diverticulum, but no enhancing renal lesions, masses, hydronephrosis, or stones  Findings: Normal cystoscopy  Assessment and Plan: Call with cytology results  Trial of topical estrogen cream for recurrent UTIs  Nickolas Madrid, MD 09/26/2020

## 2020-09-27 LAB — CYTOLOGY - NON PAP

## 2020-09-28 ENCOUNTER — Telehealth: Payer: Self-pay

## 2020-09-28 LAB — MICROSCOPIC EXAMINATION: Epithelial Cells (non renal): >10 /hpf — AB (ref 0–10)

## 2020-09-28 LAB — URINALYSIS, COMPLETE
Bilirubin, UA: NEGATIVE
Glucose, UA: NEGATIVE
Nitrite, UA: NEGATIVE
Protein,UA: NEGATIVE
Specific Gravity, UA: 1.025 (ref 1.005–1.030)
Urobilinogen, Ur: 0.2 mg/dL (ref 0.2–1.0)
pH, UA: 5 (ref 5.0–7.5)

## 2020-09-28 NOTE — Telephone Encounter (Signed)
Called pt informed her of the information below, pt gave verbal understanding.

## 2020-09-28 NOTE — Telephone Encounter (Signed)
-----   Message from Billey Co, MD sent at 09/28/2020  7:23 AM EST ----- No suspicious cells on urine sample, follow-up as scheduled  Nickolas Madrid, MD 09/28/2020

## 2020-11-12 ENCOUNTER — Ambulatory Visit: Payer: Self-pay | Admitting: Urology

## 2020-11-14 ENCOUNTER — Ambulatory Visit: Payer: Self-pay | Admitting: Urology

## 2021-05-04 ENCOUNTER — Emergency Department: Payer: Medicare Other

## 2021-05-04 ENCOUNTER — Other Ambulatory Visit: Payer: Self-pay

## 2021-05-04 ENCOUNTER — Emergency Department
Admission: EM | Admit: 2021-05-04 | Discharge: 2021-05-04 | Disposition: A | Discharge status: Home / Self Care | Payer: Medicare Other | Attending: Emergency Medicine | Admitting: Emergency Medicine

## 2021-05-04 DIAGNOSIS — Z79899 Other long term (current) drug therapy: Secondary | ICD-10-CM | POA: Insufficient documentation

## 2021-05-04 DIAGNOSIS — Z87891 Personal history of nicotine dependence: Secondary | ICD-10-CM | POA: Diagnosis not present

## 2021-05-04 DIAGNOSIS — R42 Dizziness and giddiness: Secondary | ICD-10-CM | POA: Diagnosis present

## 2021-05-04 DIAGNOSIS — R519 Headache, unspecified: Secondary | ICD-10-CM | POA: Insufficient documentation

## 2021-05-04 DIAGNOSIS — Z85828 Personal history of other malignant neoplasm of skin: Secondary | ICD-10-CM | POA: Insufficient documentation

## 2021-05-04 DIAGNOSIS — Z7982 Long term (current) use of aspirin: Secondary | ICD-10-CM | POA: Diagnosis not present

## 2021-05-04 DIAGNOSIS — I498 Other specified cardiac arrhythmias: Secondary | ICD-10-CM

## 2021-05-04 DIAGNOSIS — R008 Other abnormalities of heart beat: Secondary | ICD-10-CM | POA: Diagnosis not present

## 2021-05-04 DIAGNOSIS — R002 Palpitations: Secondary | ICD-10-CM | POA: Diagnosis not present

## 2021-05-04 DIAGNOSIS — I10 Essential (primary) hypertension: Secondary | ICD-10-CM | POA: Insufficient documentation

## 2021-05-04 LAB — CBC
HCT: 39.5 % (ref 36.0–46.0)
Hemoglobin: 14 g/dL (ref 12.0–15.0)
MCH: 33.3 pg (ref 26.0–34.0)
MCHC: 35.4 g/dL (ref 30.0–36.0)
MCV: 93.8 fL (ref 80.0–100.0)
Platelets: 286 10*3/uL (ref 150–400)
RBC: 4.21 MIL/uL (ref 3.87–5.11)
RDW: 12.7 % (ref 11.5–15.5)
WBC: 8.7 10*3/uL (ref 4.0–10.5)
nRBC: 0 % (ref 0.0–0.2)

## 2021-05-04 LAB — MAGNESIUM: Magnesium: 2.2 mg/dL (ref 1.7–2.4)

## 2021-05-04 LAB — BASIC METABOLIC PANEL
Anion gap: 10 (ref 5–15)
BUN: 18 mg/dL (ref 8–23)
CO2: 25 mmol/L (ref 22–32)
Calcium: 8.7 mg/dL — ABNORMAL LOW (ref 8.9–10.3)
Chloride: 101 mmol/L (ref 98–111)
Creatinine, Ser: 0.71 mg/dL (ref 0.44–1.00)
GFR, Estimated: >60 mL/min (ref >60)
Glucose, Bld: 110 mg/dL — ABNORMAL HIGH (ref 70–99)
Potassium: 3.6 mmol/L (ref 3.5–5.1)
Sodium: 136 mmol/L (ref 135–145)

## 2021-05-04 LAB — TROPONIN I (HIGH SENSITIVITY): Troponin I (High Sensitivity): 3 ng/L (ref <18)

## 2021-05-04 LAB — URINALYSIS, COMPLETE (UACMP) WITH MICROSCOPIC
Bacteria, UA: NONE SEEN
Bilirubin Urine: NEGATIVE
Glucose, UA: NEGATIVE mg/dL
Hgb urine dipstick: NEGATIVE
Ketones, ur: NEGATIVE mg/dL
Leukocytes,Ua: NEGATIVE
Nitrite: NEGATIVE
Protein, ur: NEGATIVE mg/dL
Specific Gravity, Urine: 1.02 (ref 1.005–1.030)
pH: 5 (ref 5.0–8.0)

## 2021-05-04 NOTE — ED Triage Notes (Addendum)
Pt comes pov with dizziness, ear pressure, and some left leg weakness that started about 2 hours ago. Neuro exam WNL-equal grips, strength in legs and arms equal, no facial droop, able to raise eyebrows. States some head pain for 2 days and middle back pain as well.

## 2021-05-04 NOTE — ED Notes (Signed)
Patient stable and discharged with all personal belongings and AVS. AVS and discharge instructions reviewed with patient and opportunity for questions provided.   

## 2021-05-04 NOTE — ED Provider Notes (Signed)
[facility] Emergency Department Provider Note   ____________________________________________   Event Date/Time   First MD Initiated Contact with Patient 05/04/21 2016     (approximate)  I have reviewed the triage vital signs and the nursing notes.   HISTORY  Chief Complaint Weakness    HPI Regina Lara is a 70 y.o. female with past medical history of hypertension, hyperlipidemia, GERD, and frequent PVCs who presents to the ED complaining of dizziness.  Patient reports that she has been feeling intermittently dizzy throughout the course of the day today.  She states it will often happen when she goes to stand up or when she moves her head in certain positions.  She describes it as a lightheadedness, but states that has not gotten so severe that she feels like she is going to pass out.  She denies any feeling of the room spinning around her and has not had any numbness or weakness.  She does state that she has been dealing with a headache.  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        Past Medical History:  Diagnosis Date   Cancer ([place])    skin   GERD (gastroesophageal reflux disease)    Hemorrhoids    History of hiatal hernia    Hyperlipidemia    Hypertension    Osteopenia    PVC (premature ventricular contraction)    Thyroid nodule    followed by Dr. Kathyrn Sheriff    There are no problems to display for this patient.   Past Surgical History:  Procedure Laterality Date   ABDOMINAL HYSTERECTOMY     CERVICAL BIOPSY  1980's   COLONOSCOPY  08/29/2008, 10/28/2013   COLONOSCOPY WITH PROPOFOL N/A 04/11/2019   Procedure: COLONOSCOPY WITH PROPOFOL;  Surgeon: Lollie Sails, MD;  Location: [facility] ENDOSCOPY;  Service: Endoscopy;  Laterality: N/A;   DILATION AND CURETTAGE OF UTERUS     ESOPHAGOGASTRODUODENOSCOPY     SKIN CANCER EXCISION      TONSILLECTOMY      Prior to Admission medications   Medication Sig Start Date End Date Taking? Authorizing Provider  ASPIRIN 81 PO Take by mouth.    [provider]  atorvastatin (LIPITOR) 10 MG tablet Take 10 mg by mouth daily.    [provider]  conjugated estrogens (PREMARIN) vaginal cream Estrogen Cream Instruction Discard applicator Apply pea sized amount to tip of finger to urethra before bed. Wash hands well after application. Use Monday, Wednesday and Friday 09/26/20   Billey Co, MD  diazepam (VALIUM) 5 MG tablet Take 1 tablet (5 mg total) by mouth once as needed for up to 1 dose (take 30 minutes prior to cystoscopy). 09/04/20   Billey Co, MD  diltiazem (CARDIZEM) 120 MG tablet Take 120 mg by mouth 4 (four) times daily.    [provider]  ergocalciferol (VITAMIN D2) 1.25 MG (50000 UT) capsule Take 50,000 Units by mouth every 14 (fourteen) days.    [provider]  esomeprazole (NEXIUM) 20 MG capsule Take 20 mg by mouth daily as needed.    [provider]  lisinopril (PRINIVIL,ZESTRIL) 5 MG tablet Take 5 mg by mouth daily.    [provider]  vitamin B-12 (CYANOCOBALAMIN) 500 MCG tablet Take 500 mcg by mouth daily.    [provider]    Allergies  Macrobid [nitrofurantoin macrocrystal], Melatonin, Penicillins, and Clindamycin/lincomycin  History reviewed. No pertinent family history.  Social History Social History   Tobacco Use   Smoking status: Former    Types: Cigarettes    Quit date: 06/23/2005    Years since quitting: 15.8   Smokeless tobacco: Never  Substance Use Topics   Alcohol use: No   Drug use: No    Review of Systems  Constitutional: No fever/chills Eyes: No visual changes. ENT: No sore throat. Cardiovascular: Denies chest pain.  Positive for palpitations.  Positive for dizziness and lightheadedness. Respiratory: Denies shortness of breath. Gastrointestinal: No abdominal pain.  No  nausea, no vomiting.  No diarrhea.  No constipation. Genitourinary: Negative for dysuria. Musculoskeletal: Negative for back pain. Skin: Negative for rash. Neurological: Positive for headache, negative for focal weakness or numbness.  ____________________________________________   PHYSICAL EXAM:  VITAL SIGNS: ED Triage Vitals  Enc Vitals Group     BP 05/04/21 1645 138/81     Pulse Rate 05/04/21 1645 81     Resp 05/04/21 1645 18     Temp 05/04/21 1645 98.4 F (36.9 C)     Temp Source 05/04/21 1645 Oral     SpO2 05/04/21 1645 97 %     Weight 05/04/21 1643 150 lb (68 kg)     Height 05/04/21 1643 5\' 1"  (1.549 m)     Head Circumference --      Peak Flow --      Pain Score 05/04/21 1642 4     Pain Loc --      Pain Edu? --      Excl. in [place]? --     Constitutional: Alert and oriented. Eyes: Conjunctivae are normal. Head: Atraumatic. Nose: No congestion/rhinnorhea. Mouth/Throat: Mucous membranes are moist. Neck: Normal ROM Cardiovascular: Normal rate, regular rhythm. Grossly normal heart sounds.  2+ radial pulses bilaterally. Respiratory: Normal respiratory effort.  No retractions. Lungs CTAB. Gastrointestinal: Soft and nontender. No distention. Genitourinary: deferred Musculoskeletal: No lower extremity tenderness nor edema. Neurologic:  Normal speech and language. No gross focal neurologic deficits are appreciated. Skin:  Skin is warm, dry and intact. No rash noted. Psychiatric: Mood and affect are normal. Speech and behavior are normal.  ____________________________________________   LABS (all labs ordered are listed, but only abnormal results are displayed)  Labs Reviewed  BASIC METABOLIC PANEL - Abnormal; Notable for the following components:      Result Value   Glucose, Bld 110 (*)    Calcium 8.7 (*)    All other components within normal limits  URINALYSIS, COMPLETE (UACMP) WITH MICROSCOPIC - Abnormal; Notable for the following components:   Color, Urine YELLOW  (*)    APPearance CLEAR (*)    All other components within normal limits  CBC  MAGNESIUM  CBG MONITORING, ED  TROPONIN I (HIGH SENSITIVITY)   ____________________________________________  EKG  ED ECG REPORT I, Blake Divine, the attending physician, personally viewed and interpreted this ECG.   Date: 05/04/2021  EKG Time: 16:36  Rate: 81  Rhythm: normal sinus rhythm, frequent PVC's noted  Axis: Normal  Intervals:none  ST&T Change: None  ED ECG REPORT I, Blake Divine, the attending physician, personally viewed and interpreted this ECG.   Date: 05/04/2021  EKG Time: 20:32  Rate: 92  Rhythm: normal sinus rhythm, frequent PVC's noted  Axis: RAD  Intervals:none  ST&T Change: None    PROCEDURES  Procedure(s) performed (including Critical Care):  Procedures   ____________________________________________   INITIAL IMPRESSION / ASSESSMENT  AND PLAN / ED COURSE      70 year old female with past medical history of hypertension, hyperlipidemia, GERD, and frequent PVCs who presents to the ED complaining of intermittent dizziness throughout the day today associated with headache and palpitations.  EKG shows bigeminy with no ischemic changes, which could be contributing to patient's dizziness.  There is no evidence of electrolyte abnormality and troponin is within normal limits.  CT head was performed and negative for acute process, doubt SAH or stroke given description of dizziness as a lightheadedness.  These findings were discussed with Dr. Saralyn Pilar of cardiology, who states that patient would be appropriate for discharge home with outpatient follow-up given she is hemodynamically stable.  He recommends changing to metoprolol if patient is able to tolerate this, however patient states she was not able to tolerate side effects with metoprolol in the past.  Patient able to stand and ambulate without significant dizziness or change in blood pressure at this time.  She is  appropriate for discharge home and was counseled to follow-up with Dr. Satira Mccallum, otherwise return to the ED for new worsening symptoms.  Patient agrees with plan.      ____________________________________________   FINAL CLINICAL IMPRESSION(S) / ED DIAGNOSES  Final diagnoses:  Dizziness  Ventricular bigeminy     ED Discharge Orders     None        Note:  This document was prepared using Dragon voice recognition software and may include unintentional dictation errors.    Blake Divine, MD 05/05/21 0010

## 2021-05-04 NOTE — ED Notes (Signed)
Patient c/o dizziness that started today. States its with changes in position, when she's looking up or sitting down.

## 2022-07-22 ENCOUNTER — Ambulatory Visit: Payer: Medicare Other | Admitting: General Practice

## 2022-07-22 ENCOUNTER — Encounter
Admission: RE | Disposition: A | Discharge status: Home / Self Care | Payer: Self-pay | Source: Ambulatory Visit | Attending: Gastroenterology

## 2022-07-22 ENCOUNTER — Encounter: Payer: Self-pay | Admitting: *Deleted

## 2022-07-22 ENCOUNTER — Other Ambulatory Visit: Payer: Self-pay

## 2022-07-22 ENCOUNTER — Ambulatory Visit
Admission: RE | Admit: 2022-07-22 | Discharge: 2022-07-22 | Disposition: A | Discharge status: Home / Self Care | Payer: Medicare Other | Source: Ambulatory Visit | Attending: Gastroenterology | Admitting: Gastroenterology

## 2022-07-22 DIAGNOSIS — K200 Eosinophilic esophagitis: Secondary | ICD-10-CM | POA: Diagnosis not present

## 2022-07-22 DIAGNOSIS — E785 Hyperlipidemia, unspecified: Secondary | ICD-10-CM | POA: Insufficient documentation

## 2022-07-22 DIAGNOSIS — K219 Gastro-esophageal reflux disease without esophagitis: Secondary | ICD-10-CM | POA: Diagnosis not present

## 2022-07-22 DIAGNOSIS — K449 Diaphragmatic hernia without obstruction or gangrene: Secondary | ICD-10-CM | POA: Insufficient documentation

## 2022-07-22 DIAGNOSIS — I509 Heart failure, unspecified: Secondary | ICD-10-CM | POA: Diagnosis not present

## 2022-07-22 DIAGNOSIS — I110 Hypertensive heart disease with heart failure: Secondary | ICD-10-CM | POA: Insufficient documentation

## 2022-07-22 DIAGNOSIS — R1310 Dysphagia, unspecified: Secondary | ICD-10-CM | POA: Insufficient documentation

## 2022-07-22 HISTORY — PX: ESOPHAGOGASTRODUODENOSCOPY (EGD) WITH PROPOFOL: SHX5813

## 2022-07-22 SURGERY — ESOPHAGOGASTRODUODENOSCOPY (EGD) WITH PROPOFOL
Anesthesia: General

## 2022-07-22 MED ORDER — PROPOFOL 10 MG/ML IV BOLUS
INTRAVENOUS | Status: DC | PRN
  Administered 2022-07-22: 200 mg via INTRAVENOUS

## 2022-07-22 MED ORDER — LIDOCAINE HCL (PF) 2 % IJ SOLN
INTRAMUSCULAR | Status: AC
  Filled 2022-07-22: qty 5

## 2022-07-22 MED ORDER — PROPOFOL 1000 MG/100ML IV EMUL
INTRAVENOUS | Status: AC
  Filled 2022-07-22: qty 100

## 2022-07-22 MED ORDER — SODIUM CHLORIDE 0.9 % IV SOLN: INTRAVENOUS | Status: DC

## 2022-07-22 MED ORDER — PROPOFOL 10 MG/ML IV BOLUS
INTRAVENOUS | Status: AC
  Filled 2022-07-22: qty 40

## 2022-07-22 MED ORDER — LIDOCAINE HCL (CARDIAC) PF 100 MG/5ML IV SOSY
PREFILLED_SYRINGE | INTRAVENOUS | Status: DC | PRN
  Administered 2022-07-22: 50 mg via INTRAVENOUS

## 2022-07-22 MED ORDER — GLYCOPYRROLATE 0.2 MG/ML IJ SOLN
INTRAMUSCULAR | Status: DC | PRN
  Administered 2022-07-22: .2 mg via INTRAVENOUS

## 2022-07-22 NOTE — Anesthesia Procedure Notes (Signed)
Procedure Name: MAC Date/Time: 07/22/2022 9:09 AM  Performed by: Candice Camp, CRNAPre-anesthesia Checklist: Patient identified, Emergency Drugs available, Suction available, Patient being monitored and Timeout performed Patient Re-evaluated:Patient Re-evaluated prior to induction Oxygen Delivery Method: Simple face mask Preoxygenation: Pre-oxygenation with 100% oxygen Induction Type: IV induction Ventilation: Nasal airway inserted- appropriate to patient size Nasal Tubes: Right Number of attempts: 1 Dental Injury: Teeth and Oropharynx as per pre-operative assessment

## 2022-07-22 NOTE — H&P (Signed)
Outpatient short stay form Pre-procedure 07/22/2022  [name], MD  Primary Physician: Sallee Lange, NP  Reason for visit:  Dysphagia/Globus sensation  History of present illness:    72 y/o lady with history of hypertension, HLD, and GERD here for EGD for globus sensation. No blood thinners except aspirin. No family history of GI malignancies. No neck surgeries.    Current Facility-Administered Medications:    0.9 %  sodium chloride infusion, , Intravenous, Continuous, Katty Fretwell, Hilton Cork, MD, Last Rate: 20 mL/hr at 07/22/22 0819, New Bag at 07/22/22 0819  Medications Prior to Admission  Medication Sig Dispense Refill Last Dose   atorvastatin (LIPITOR) 10 MG tablet Take 10 mg by mouth daily.   07/21/2022   diltiazem (CARDIZEM) 120 MG tablet Take 120 mg by mouth 4 (four) times daily.   07/21/2022   lisinopril (PRINIVIL,ZESTRIL) 5 MG tablet Take 5 mg by mouth daily.   07/22/2022 at 0520   omeprazole (PRILOSEC) 20 MG capsule Take 20 mg by mouth daily.   07/21/2022   ASPIRIN 81 PO Take by mouth.   07/20/2022   conjugated estrogens (PREMARIN) vaginal cream Estrogen Cream Instruction Discard applicator Apply pea sized amount to tip of finger to urethra before bed. Wash hands well after application. Use Monday, Wednesday and Friday 42.5 g 11    diazepam (VALIUM) 5 MG tablet Take 1 tablet (5 mg total) by mouth once as needed for up to 1 dose (take 30 minutes prior to cystoscopy). 1 tablet 0    ergocalciferol (VITAMIN D2) 1.25 MG (50000 UT) capsule Take 50,000 Units by mouth every 14 (fourteen) days.   07/20/2022   esomeprazole (NEXIUM) 20 MG capsule Take 20 mg by mouth daily as needed. (Patient not taking: Reported on 07/22/2022)   Not Taking   vitamin B-12 (CYANOCOBALAMIN) 500 MCG tablet Take 500 mcg by mouth daily.   07/20/2022     Allergies  Allergen Reactions   Macrobid [Nitrofurantoin Macrocrystal]    Melatonin    Penicillins    Clindamycin/Lincomycin Rash     Past Medical  History:  Diagnosis Date   Cancer ([place])    skin   GERD (gastroesophageal reflux disease)    Hemorrhoids    History of hiatal hernia    Hyperlipidemia    Hypertension    Osteopenia    PVC (premature ventricular contraction)    Thyroid nodule    followed by Dr. Kathyrn Sheriff    Review of systems:  Otherwise negative.    Physical Exam  Gen: Alert, oriented. Appears stated age.  HEENT: PERRLA. Lungs: No respiratory distress CV: RRR Abd: soft, benign, no masses Ext: No edema    Planned procedures: Proceed with EGD. The patient understands the nature of the planned procedure, indications, risks, alternatives and potential complications including but not limited to bleeding, infection, perforation, damage to internal organs and possible oversedation/side effects from anesthesia. The patient agrees and gives consent to proceed.  Please refer to procedure notes for findings, recommendations and patient disposition/instructions.     [name], MD [facility] Gastroenterology

## 2022-07-22 NOTE — Op Note (Signed)
[facility]astroenterology Patient Name: Regina Lara Procedure Date: 07/22/2022 8:47 AM MRN: [id number] Account #: [id number] Date of Birth: 1950/12/10 Admit Type: Outpatient Age: 72 Room: [facility] ENDO ROOM 1 Gender: Female Note Status: Finalized Instrument Name: Upper Endoscope [id number] Procedure:             Upper GI endoscopy Indications:           Dysphagia, Gastro-esophageal reflux disease Providers:             Andrey Farmer MD, MD Referring MD:          No Local Md, MD (Referring MD) Medicines:             Monitored Anesthesia Care Complications:         No immediate complications. Estimated blood loss:                         Minimal. Procedure:             Pre-Anesthesia Assessment:                        - Prior to the procedure, a History and Physical was                         performed, and patient medications and allergies were                         reviewed. The patient is competent. The risks and                         benefits of the procedure and the sedation options and                         risks were discussed with the patient. All questions                         were answered and informed consent was obtained.                         Patient identification and proposed procedure were                         verified by the physician, the nurse, the                         anesthesiologist, the anesthetist and the technician                         in the endoscopy suite. Mental Status Examination:                         alert and oriented. Airway Examination: normal                         oropharyngeal airway and neck mobility. Respiratory                         Examination: clear to auscultation. CV Examination:  normal. Prophylactic Antibiotics: The patient does not                         require prophylactic antibiotics. Prior                         Anticoagulants: The patient has taken no  anticoagulant                         or antiplatelet agents except for aspirin. ASA Grade                         Assessment: III - A patient with severe systemic                         disease. After reviewing the risks and benefits, the                         patient was deemed in satisfactory condition to                         undergo the procedure. The anesthesia plan was to use                         monitored anesthesia care (MAC). Immediately prior to                         administration of medications, the patient was                         re-assessed for adequacy to receive sedatives. The                         heart rate, respiratory rate, oxygen saturations,                         blood pressure, adequacy of pulmonary ventilation, and                         response to care were monitored throughout the                         procedure. The physical status of the patient was                         re-assessed after the procedure.                        After obtaining informed consent, the endoscope was                         passed under direct vision. Throughout the procedure,                         the patient's blood pressure, pulse, and oxygen                         saturations were monitored continuously. The Endoscope  was introduced through the mouth, and advanced to the                         second part of duodenum. The upper GI endoscopy was                         accomplished without difficulty. The patient tolerated                         the procedure well. Findings:      A 5 cm hiatal hernia was present.      Normal mucosa was found in the entire esophagus. Biopsies were obtained       from the proximal and distal esophagus with cold forceps for histology       of suspected eosinophilic esophagitis. Estimated blood loss was minimal.      The entire examined stomach was normal.      The examined duodenum was  normal. Impression:            - 5 cm hiatal hernia.                        - Normal mucosa was found in the entire esophagus.                        - Normal stomach.                        - Normal examined duodenum.                        - Biopsies were taken with a cold forceps for                         evaluation of eosinophilic esophagitis. Recommendation:        - Discharge patient to home.                        - Resume previous diet.                        - Continue present medications.                        - Await pathology results.                        - Return to referring physician as previously                         scheduled. Procedure Code(s):     --- Professional ---                        [phone], Esophagogastroduodenoscopy, flexible,                         transoral; with biopsy, single or multiple Diagnosis Code(s):     --- Professional ---                        K44.9, Diaphragmatic hernia without obstruction or  gangrene                        R13.10, Dysphagia, unspecified                        K21.9, Gastro-esophageal reflux disease without                         esophagitis CPT copyright 2022 American Medical Association. All rights reserved. The codes documented in this report are preliminary and upon coder review may  be revised to meet current compliance requirements. Andrey Farmer MD, MD 07/22/2022 9:18:53 AM Number of Addenda: 0 Note Initiated On: 07/22/2022 8:47 AM Estimated Blood Loss:  Estimated blood loss was minimal.      [facility]

## 2022-07-22 NOTE — Anesthesia Postprocedure Evaluation (Signed)
Anesthesia Post Note  Patient: Regina Lara  Procedure(s) Performed: ESOPHAGOGASTRODUODENOSCOPY (EGD) WITH PROPOFOL  Patient location during evaluation: PACU Anesthesia Type: General Level of consciousness: awake Pain management: satisfactory to patient Vital Signs Assessment: post-procedure vital signs reviewed and stable Respiratory status: spontaneous breathing and nonlabored ventilation Cardiovascular status: stable Anesthetic complications: no  No notable events documented.   Last Vitals:  Vitals:   07/22/22 0950 07/22/22 1000  BP: 124/68 134/68  Pulse: 76 70  Resp: (!) 22 20  Temp:    SpO2: 96% 97%    Last Pain:  Vitals:   07/22/22 1000  TempSrc:   PainSc: 0-No pain                 VAN STAVEREN,Athenia Rys

## 2022-07-22 NOTE — Transfer of Care (Signed)
Immediate Anesthesia Transfer of Care Note  Patient: Regina Lara  Procedure(s) Performed: ESOPHAGOGASTRODUODENOSCOPY (EGD) WITH PROPOFOL  Patient Location: PACU and Endoscopy Unit  Anesthesia Type:MAC  Level of Consciousness: awake, oriented, and patient cooperative  Airway & Oxygen Therapy: Patient Spontanous Breathing  Post-op Assessment: Report given to RN, Post -op Vital signs reviewed and stable, and Patient moving all extremities  Post vital signs: Reviewed and stable  Last Vitals:  Vitals Value Taken Time  BP 126/68 07/22/22 0930  Temp 36.2 C 07/22/22 0930  Pulse 84 07/22/22 0930  Resp 23 07/22/22 0930  SpO2 96 % 07/22/22 0930    Last Pain:  Vitals:   07/22/22 0930  TempSrc: Temporal  PainSc: 0-No pain         Complications: No notable events documented.

## 2022-07-22 NOTE — Interval H&P Note (Signed)
History and Physical Interval Note:  07/22/2022 8:55 AM  Regina Lara  has presented today for surgery, with the diagnosis of Dysphagia.  The various methods of treatment have been discussed with the patient and family. After consideration of risks, benefits and other options for treatment, the patient has consented to  Procedure(s): ESOPHAGOGASTRODUODENOSCOPY (EGD) WITH PROPOFOL (N/A) as a surgical intervention.  The patient's history has been reviewed, patient examined, no change in status, stable for surgery.  I have reviewed the patient's chart and labs.  Questions were answered to the patient's satisfaction.     [name]  Ok to proceed with EGD

## 2022-07-22 NOTE — Anesthesia Preprocedure Evaluation (Addendum)
Anesthesia Evaluation  Patient identified by MRN, date of birth, ID band Patient awake    Reviewed: Allergy & Precautions, NPO status , Patient's Chart, lab work & pertinent test results, reviewed documented beta blocker date and time   Airway Mallampati: II  TM Distance: >3 FB Neck ROM: full    Dental  (+) Chipped   Pulmonary former smoker   Pulmonary exam normal        Cardiovascular hypertension, Pt. on medications (-) angina +CHF  Normal cardiovascular exam Rate:Bradycardia     Neuro/Psych negative neurological ROS  negative psych ROS   GI/Hepatic negative GI ROS, Neg liver ROS, hiatal hernia,GERD  ,,  Endo/Other  negative endocrine ROS    Renal/GU negative Renal ROS  negative genitourinary   Musculoskeletal   Abdominal   Peds  Hematology negative hematology ROS (+)   Anesthesia Other Findings Hx of PVCs.  Reproductive/Obstetrics negative OB ROS                             Anesthesia Physical Anesthesia Plan  ASA: 3  Anesthesia Plan: General   Post-op Pain Management: Minimal or no pain anticipated   Induction: Intravenous  PONV Risk Score and Plan: 3 and Propofol infusion and TIVA  Airway Management Planned: Nasal Cannula and Natural Airway  Additional Equipment: None  Intra-op Plan:   Post-operative Plan:   Informed Consent: I have reviewed the patients History and Physical, chart, labs and discussed the procedure including the risks, benefits and alternatives for the proposed anesthesia with the patient or authorized representative who has indicated his/her understanding and acceptance.     Dental advisory given  Plan Discussed with: CRNA  Anesthesia Plan Comments: (Discussed risks of anesthesia with patient, including possibility of difficulty with spontaneous ventilation under anesthesia necessitating airway intervention, PONV, and rare risks such as cardiac or  respiratory or neurological events, and allergic reactions. Discussed the role of CRNA in patient's perioperative care. Patient understands.)        Anesthesia Quick Evaluation

## 2022-07-23 ENCOUNTER — Encounter: Payer: Self-pay | Admitting: Gastroenterology

## 2022-07-23 LAB — SURGICAL PATHOLOGY

## 2023-07-04 IMAGING — CT CT HEAD W/O CM
3 series · 16 of 47 positions shown, 19 images · non-contrast
Comparison: None.

CLINICAL DATA: Dizziness

EXAM:
CT HEAD WITHOUT CONTRAST
TECHNIQUE: Contiguous axial images were obtained from the base of the skull
through the vertex without intravenous contrast.

[Series 2: head wo · axial · 0.41mm/px · z∈[-73,+62]mm · 10 of 33 slices shown, 13 images]
[im 3/33  brain]
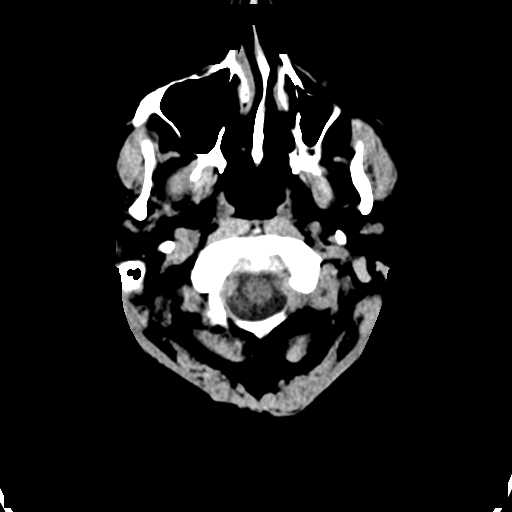
[im 3/33  bone]
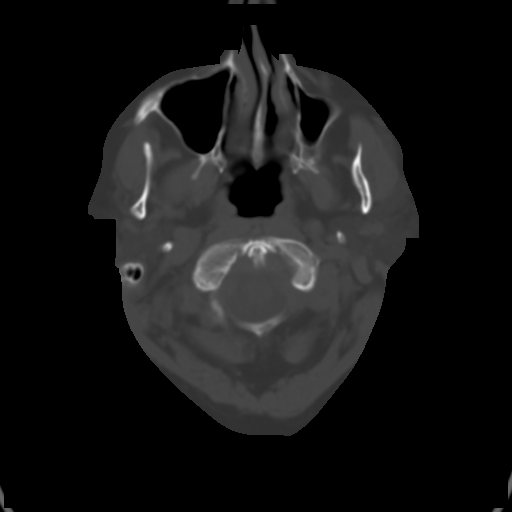
[im 6/33  brain]
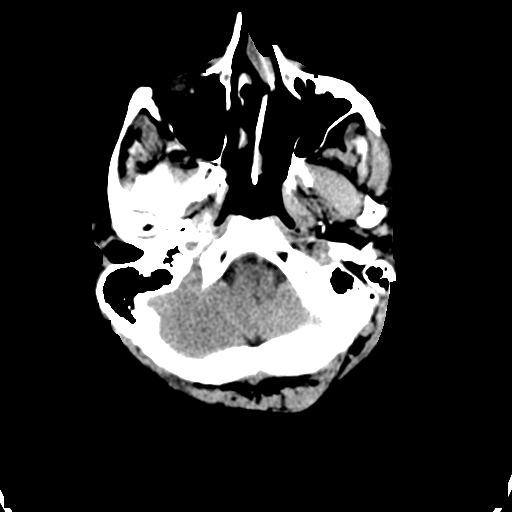
[im 9/33  brain]
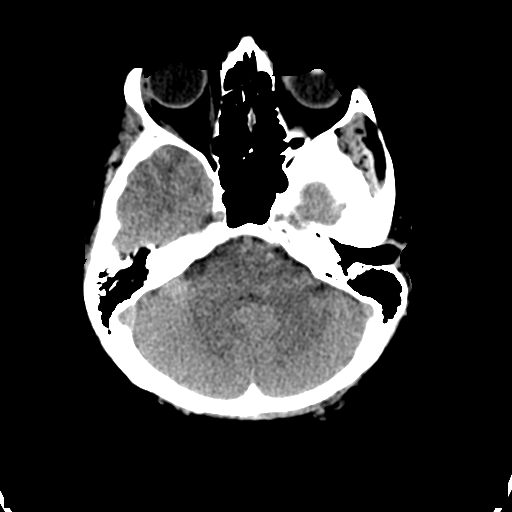
[im 12/33  brain]
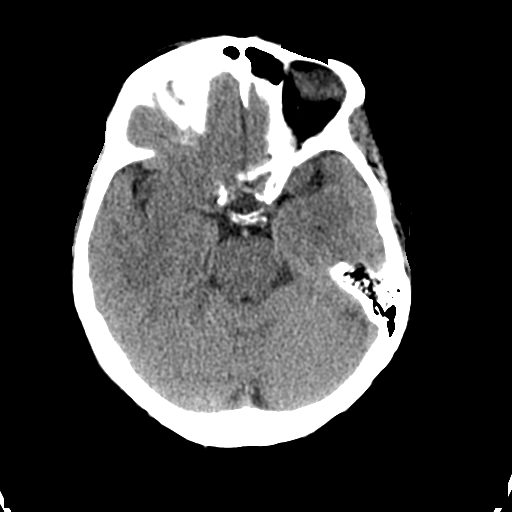
[im 15/33  brain]
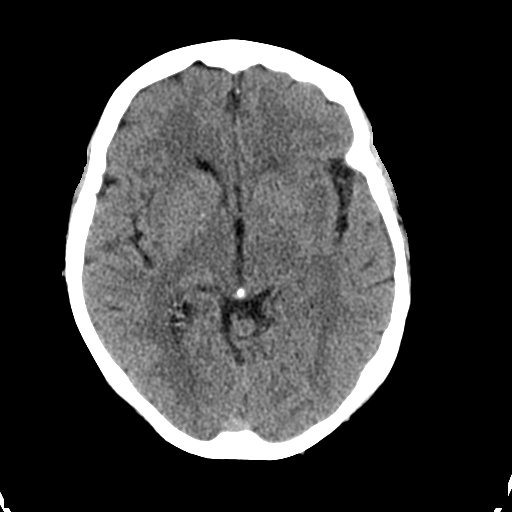
[im 15/33  bone]
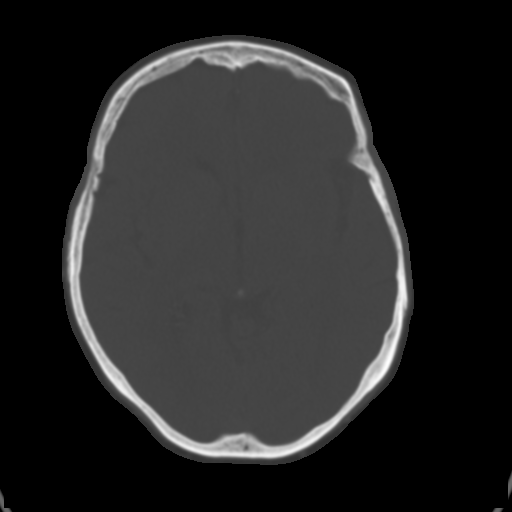
[im 18/33  brain]
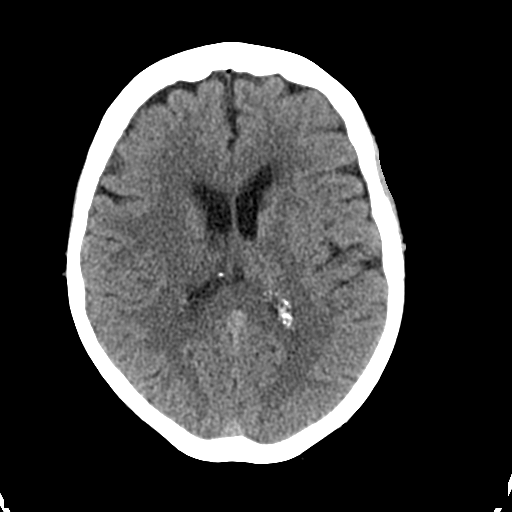
[im 21/33  brain]
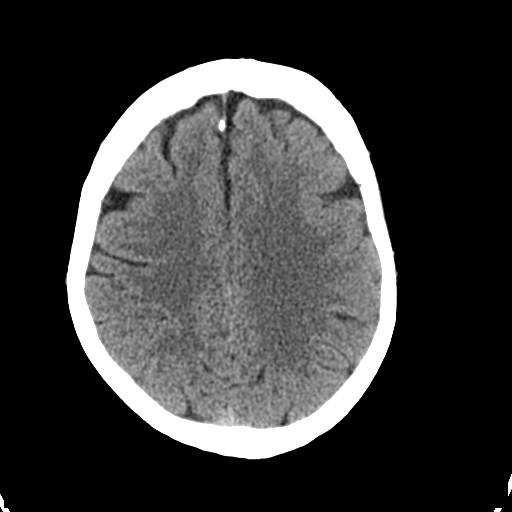
[im 25/33  brain]
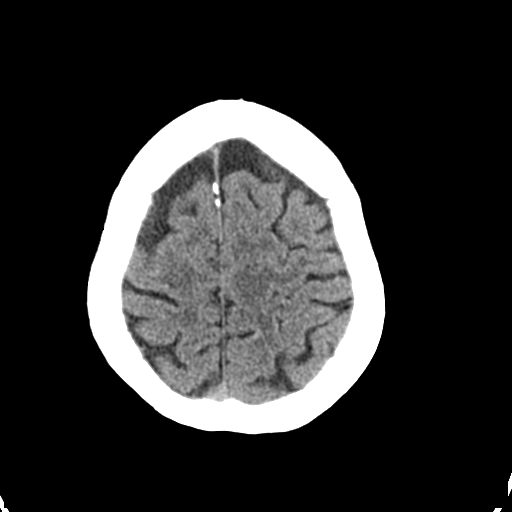
[im 27/33  brain]
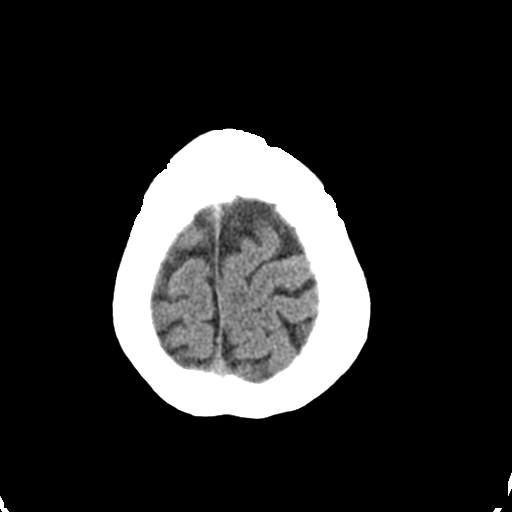
[im 27/33  bone]
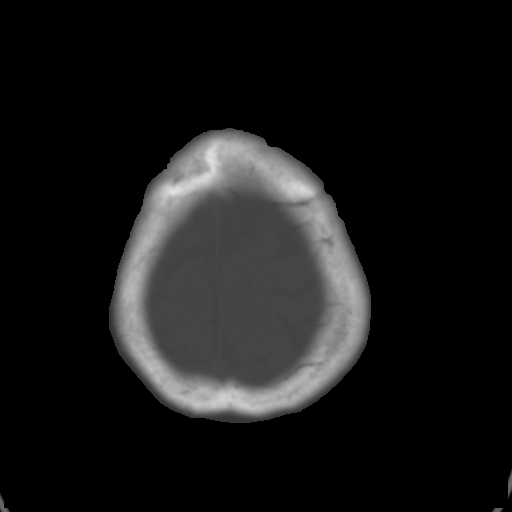
[im 30/33  brain]
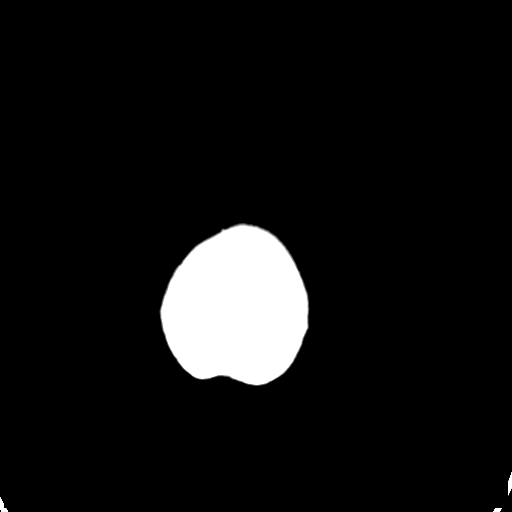

[Series 4: coronal soft tissue · coronal · 0.33mm/px · 3 of 67 slices shown]
[im 23/67  brain]
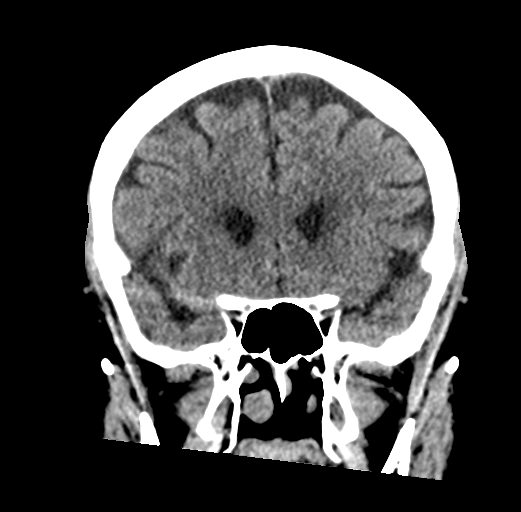
[im 30/67  brain]
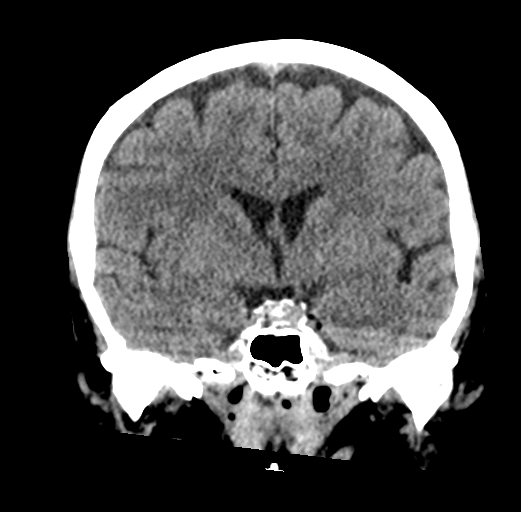
[im 37/67  brain]
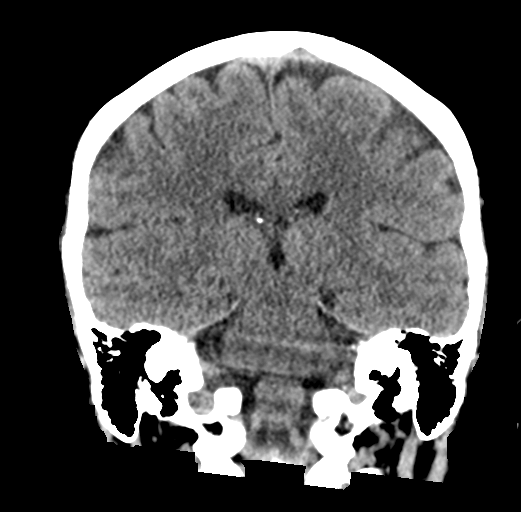

[Series 5: sagittal soft tissue · sagittal · 0.34mm/px · 3 of 59 slices shown]
[im 22/59  brain]
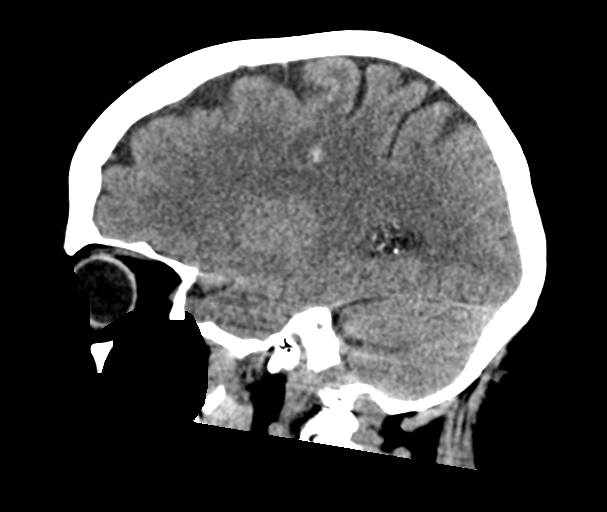
[im 30/59  brain]
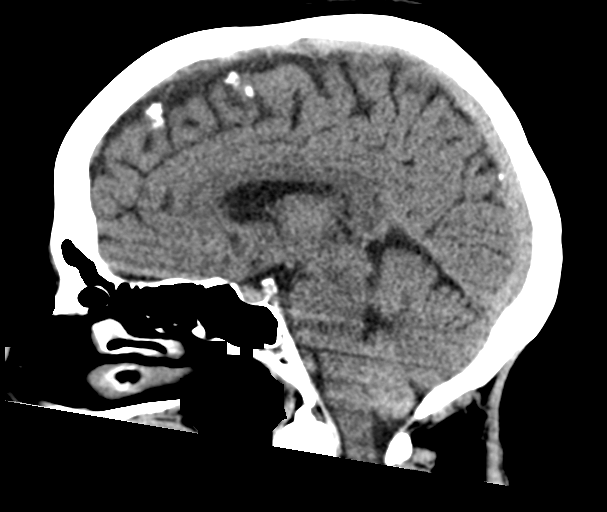
[im 37/59  brain]
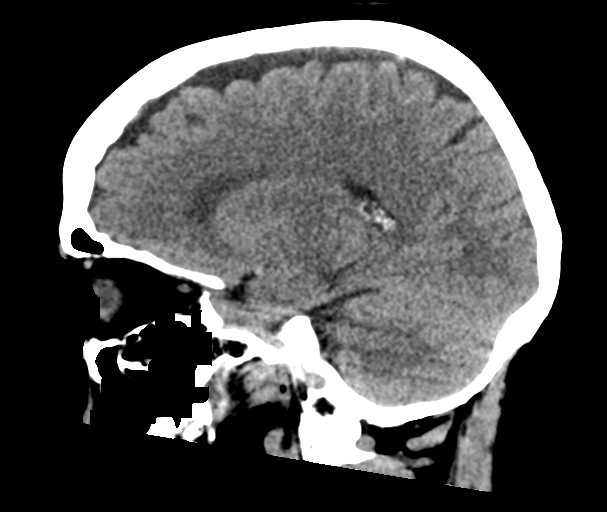

[16 of 47 positions shown; findings below may reference images not displayed]

FINDINGS: Brain: Small hyperdense area in the right frontal lobe measures up
to 5 mm. This likely is not acute blood, rather mineralization or
cavernous malformation. No acute intracranial abnormality.
Specifically, no hemorrhage, hydrocephalus, mass lesion, acute
infarction, or significant intracranial injury.

Vascular: No hyperdense vessel or unexpected calcification.

Skull: No acute calvarial abnormality.

Sinuses/Orbits: Visualized paranasal sinuses and mastoids clear.
Orbital soft tissues unremarkable.

Other: None
IMPRESSION: No acute intracranial abnormality.

## 2023-07-10 ENCOUNTER — Other Ambulatory Visit: Payer: Self-pay

## 2023-07-10 ENCOUNTER — Emergency Department: Payer: Medicare Other

## 2023-07-10 ENCOUNTER — Emergency Department
Admission: EM | Admit: 2023-07-10 | Discharge: 2023-07-10 | Disposition: A | Discharge status: Home / Self Care | Payer: Medicare Other | Attending: Emergency Medicine | Admitting: Emergency Medicine

## 2023-07-10 DIAGNOSIS — R001 Bradycardia, unspecified: Secondary | ICD-10-CM | POA: Diagnosis present

## 2023-07-10 DIAGNOSIS — B369 Superficial mycosis, unspecified: Secondary | ICD-10-CM

## 2023-07-10 DIAGNOSIS — I493 Ventricular premature depolarization: Secondary | ICD-10-CM | POA: Insufficient documentation

## 2023-07-10 DIAGNOSIS — I10 Essential (primary) hypertension: Secondary | ICD-10-CM | POA: Diagnosis not present

## 2023-07-10 DIAGNOSIS — B49 Unspecified mycosis: Secondary | ICD-10-CM | POA: Diagnosis not present

## 2023-07-10 LAB — CBC
HCT: 42 % (ref 36.0–46.0)
Hemoglobin: 13.9 g/dL (ref 12.0–15.0)
MCH: 30.5 pg (ref 26.0–34.0)
MCHC: 33.1 g/dL (ref 30.0–36.0)
MCV: 92.3 fL (ref 80.0–100.0)
Platelets: 286 10*3/uL (ref 150–400)
RBC: 4.55 MIL/uL (ref 3.87–5.11)
RDW: 12.4 % (ref 11.5–15.5)
WBC: 6.8 10*3/uL (ref 4.0–10.5)
nRBC: 0 % (ref 0.0–0.2)

## 2023-07-10 LAB — BASIC METABOLIC PANEL
Anion gap: 10 (ref 5–15)
BUN: 13 mg/dL (ref 8–23)
CO2: 24 mmol/L (ref 22–32)
Calcium: 9.1 mg/dL (ref 8.9–10.3)
Chloride: 106 mmol/L (ref 98–111)
Creatinine, Ser: 0.72 mg/dL (ref 0.44–1.00)
GFR, Estimated: >60 mL/min (ref >60)
Glucose, Bld: 92 mg/dL (ref 70–99)
Potassium: 3.9 mmol/L (ref 3.5–5.1)
Sodium: 140 mmol/L (ref 135–145)

## 2023-07-10 LAB — TROPONIN I (HIGH SENSITIVITY): Troponin I (High Sensitivity): 4 ng/L (ref <18)

## 2023-07-10 LAB — MAGNESIUM: Magnesium: 2.3 mg/dL (ref 1.7–2.4)

## 2023-07-10 MED ORDER — NYSTATIN 100000 UNIT/GM EX POWD: 1.0000 | Freq: Three times a day (TID) | CUTANEOUS | 0 refills | Status: AC

## 2023-07-10 NOTE — ED Provider Notes (Signed)
[facility] Provider Note    Event Date/Time   First MD Initiated Contact with Patient 07/10/23 1515     (approximate)   History   Bradycardia   HPI Regina Lara is a 72 y.o. female with history of HTN, HLD, PVCs presenting today for bradycardia.  Patient states she had gone into her primary care provider today for a rash on the lower part of her belly that has been going on for several weeks.  While there, she states her heart rate was picking up in the 30s and was told to come to the ED for further evaluation.  She does note history of PVCs but has never been told her heart rate has been this low in the past.  She otherwise denies any recent lightheadedness, dizziness, syncope, chest pain, shortness of breath, nausea, vomiting, diaphoresis.  Regarding the rash on her lower abdomen.  She states that it is red and itchy.  She has tried topical Neosporin and hydrocortisone without relief.  Chart review: Reviewed most recent cardiology visit on 02/12/2023.  Prior known history of palpitations and PVCs.  Previously been on diltiazem which she is still currently taking.     Physical Exam   Triage Vital Signs: ED Triage Vitals  Encounter Vitals Group     BP 07/10/23 1410 (!) 161/69     Systolic BP Percentile --      Diastolic BP Percentile --      Pulse Rate 07/10/23 1410 (!) 34     Resp 07/10/23 1410 20     Temp 07/10/23 1410 98.1 F (36.7 C)     Temp Source 07/10/23 1410 Oral     SpO2 07/10/23 1410 100 %     Weight 07/10/23 1411 151 lb 14.4 oz (68.9 kg)     Height 07/10/23 1411 5\' 1"  (1.549 m)     Head Circumference --      Peak Flow --      Pain Score 07/10/23 1410 0     Pain Loc --      Pain Education --      Exclude from Growth Chart --     Most recent vital signs: Vitals:   07/10/23 1444 07/10/23 1630  BP: (!) 157/64 130/81  Pulse: (!) 37 67  Resp: (!) 21 20  Temp:    SpO2: 100% 100%   Physical Exam: I have reviewed the vital signs  and nursing notes. General: Awake, alert, no acute distress.  Nontoxic appearing. Head:  Atraumatic, normocephalic.   ENT:  EOM intact, PERRL. Oral mucosa is pink and moist with no lesions. Neck: Neck is supple with full range of motion, No meningeal signs. Cardiovascular: Regular rate, frequent skipped/additional beats., No murmurs. Peripheral pulses palpable and equal bilaterally. Respiratory:  Symmetrical chest wall expansion.  No rhonchi, rales, or wheezes.  Good air movement throughout.  No use of accessory muscles.   Musculoskeletal:  No cyanosis or edema. Moving extremities with full ROM Abdomen:  Soft, nontender, nondistended. Neuro:  GCS 15, moving all four extremities, interacting appropriately. Speech clear. Psych:  Calm, appropriate.   Skin: Erythematous rash within the folds of her lower abdomen without signs of cellulitis or discharge   ED Results / Procedures / Treatments   Labs (all labs ordered are listed, but only abnormal results are displayed) Labs Reviewed  BASIC METABOLIC PANEL  CBC  MAGNESIUM  TROPONIN I (HIGH SENSITIVITY)  TROPONIN I (HIGH SENSITIVITY)     EKG My  EKG interpretation: Rate of 76.  Frequent PVCs.  No acute ST elevations or depressions   RADIOLOGY Independent interpreted chest x-ray with no acute pathology   PROCEDURES:  Critical Care performed: No  Procedures   MEDICATIONS ORDERED IN ED: Medications - No data to display   IMPRESSION / MDM / ASSESSMENT AND PLAN / ED COURSE  I reviewed the triage vital signs and the nursing notes.                              Differential diagnosis includes, but is not limited to, frequent PVCs, sinus bradycardia, high degree heart block  Patient's presentation is most consistent with acute complicated illness / injury requiring diagnostic workup.  Patient is a 72 year old female presenting today over concerns of bradycardia.  Has a known history of frequent PVCs and EKG shows the same today.   Monitoring her rhythm strip here, it appears that the heart rate monitor does not pick up all the PVCs which appears to be why it is calling her heart rate in the 30s.  Otherwise, stays consistently in the 70s.  Patient otherwise asymptomatic.  No evidence of hypotension.  Laboratory workup otherwise completely reassuring at this time.  Spoke with Dr. Corky Sing with cardiology.  States that patient has known history of frequent PVCs which is comparable today.  Given that she is asymptomatic, no further workup required in the ED.  Can follow-up with cardiology in 1 week for reassessment.  Will treat patient's rash as well today with nystatin cream.  Told to follow-up with cardiology and her PCP for ongoing evaluation.  Given strict return precautions.  The patient is on the cardiac monitor to evaluate for evidence of arrhythmia and/or significant heart rate changes. Clinical Course as of 07/10/23 1657  Fri Jul 10, 2023  1651 Dr. Corky Sing - hx of PVCs. Since she is completely symptomatic, no need for further evaluation here today. She can follow up with [facility] cardiology within a week.  No further medication. [DW]    Clinical Course User Index [DW] Janith Lima, MD     FINAL CLINICAL IMPRESSION(S) / ED DIAGNOSES   Final diagnoses:  Fungal rash of torso  Frequent PVCs     Rx / DC Orders   ED Discharge Orders          Ordered    nystatin (MYCOSTATIN/NYSTOP) powder  3 times daily        07/10/23 1657             Note:  This document was prepared using Dragon voice recognition software and may include unintentional dictation errors.   Janith Lima, MD 07/10/23 [phone]

## 2023-07-10 NOTE — Discharge Instructions (Signed)
Please follow-up with your cardiologist next week for reassessment.  Please come for any difficulty breathing, lightheadedness, or loss of consciousness.  I have sent a medication to your pharmacy to try to treat this rash and you can follow-up with your primary care for reassessment.

## 2023-07-10 NOTE — ED Provider Triage Note (Signed)
Emergency Medicine Provider Triage Evaluation Note  Regina Lara , a 71 y.o. female  was evaluated in triage.  Pt complains of low heart rate.  Review of Systems  Positive:  Negative:   Physical Exam  There were no vitals taken for this visit. Gen:   Awake, no distress   Resp:  Normal effort  MSK:   Moves extremities without difficulty  Other:    Medical Decision Making  Medically screening exam initiated at 2:10 PM.  Appropriate orders placed.  Regina Lara was informed that the remainder of the evaluation will be completed by another provider, this initial triage assessment does not replace that evaluation, and the importance of remaining in the ED until their evaluation is complete.  Patient looks well, however his heart rate continues to go from 34-70 intermittently, nursing staff to get a room and placed on monitor   Regina Ghee, PA-C 07/10/23 1410

## 2023-07-10 NOTE — ED Notes (Signed)
Patient denies pain and is resting comfortably.  

## 2023-07-10 NOTE — ED Triage Notes (Signed)
Pt here from [facility] with bradycardia, HR in the 30s. Pt denies CP or SOB. Pt denies any symptoms except a rash across her lower abd.

## 2024-04-26 ENCOUNTER — Ambulatory Visit: Admitting: General Practice

## 2024-04-26 ENCOUNTER — Ambulatory Visit
Admission: RE | Admit: 2024-04-26 | Discharge: 2024-04-26 | Disposition: A | Discharge status: Home / Self Care | Attending: Gastroenterology | Admitting: Gastroenterology

## 2024-04-26 ENCOUNTER — Encounter
Admission: RE | Disposition: A | Discharge status: Home / Self Care | Payer: Self-pay | Source: Home / Self Care | Attending: Gastroenterology

## 2024-04-26 ENCOUNTER — Encounter: Payer: Self-pay | Admitting: Gastroenterology

## 2024-04-26 DIAGNOSIS — K5520 Angiodysplasia of colon without hemorrhage: Secondary | ICD-10-CM | POA: Diagnosis not present

## 2024-04-26 DIAGNOSIS — Z79899 Other long term (current) drug therapy: Secondary | ICD-10-CM | POA: Insufficient documentation

## 2024-04-26 DIAGNOSIS — Z87891 Personal history of nicotine dependence: Secondary | ICD-10-CM | POA: Diagnosis not present

## 2024-04-26 DIAGNOSIS — K449 Diaphragmatic hernia without obstruction or gangrene: Secondary | ICD-10-CM | POA: Insufficient documentation

## 2024-04-26 DIAGNOSIS — I110 Hypertensive heart disease with heart failure: Secondary | ICD-10-CM | POA: Diagnosis not present

## 2024-04-26 DIAGNOSIS — I509 Heart failure, unspecified: Secondary | ICD-10-CM | POA: Diagnosis not present

## 2024-04-26 DIAGNOSIS — Z83719 Family history of colon polyps, unspecified: Secondary | ICD-10-CM | POA: Diagnosis not present

## 2024-04-26 DIAGNOSIS — K219 Gastro-esophageal reflux disease without esophagitis: Secondary | ICD-10-CM | POA: Diagnosis not present

## 2024-04-26 DIAGNOSIS — E785 Hyperlipidemia, unspecified: Secondary | ICD-10-CM | POA: Diagnosis not present

## 2024-04-26 DIAGNOSIS — Z1211 Encounter for screening for malignant neoplasm of colon: Secondary | ICD-10-CM | POA: Diagnosis present

## 2024-04-26 DIAGNOSIS — D123 Benign neoplasm of transverse colon: Secondary | ICD-10-CM | POA: Diagnosis not present

## 2024-04-26 HISTORY — PX: COLONOSCOPY: SHX5424

## 2024-04-26 HISTORY — PX: POLYPECTOMY: SHX149

## 2024-04-26 SURGERY — COLONOSCOPY
Anesthesia: General

## 2024-04-26 MED ORDER — GLYCOPYRROLATE 0.2 MG/ML IJ SOLN
INTRAMUSCULAR | Status: DC | PRN
  Administered 2024-04-26: .2 mg via INTRAVENOUS

## 2024-04-26 MED ORDER — PROPOFOL 500 MG/50ML IV EMUL
INTRAVENOUS | Status: DC | PRN
  Administered 2024-04-26: 75 ug/kg/min via INTRAVENOUS

## 2024-04-26 MED ORDER — PROPOFOL 10 MG/ML IV BOLUS
INTRAVENOUS | Status: DC | PRN
  Administered 2024-04-26 (×2): 40 mg via INTRAVENOUS
  Administered 2024-04-26: 20 mg via INTRAVENOUS

## 2024-04-26 MED ORDER — LIDOCAINE HCL (CARDIAC) PF 100 MG/5ML IV SOSY
PREFILLED_SYRINGE | INTRAVENOUS | Status: DC | PRN
  Administered 2024-04-26: 60 mg via INTRAVENOUS

## 2024-04-26 MED ORDER — LIDOCAINE HCL (PF) 2 % IJ SOLN
INTRAMUSCULAR | Status: AC
  Filled 2024-04-26: qty 5

## 2024-04-26 MED ORDER — SODIUM CHLORIDE 0.9 % IV SOLN: INTRAVENOUS | Status: DC

## 2024-04-26 MED ORDER — GLYCOPYRROLATE 0.2 MG/ML IJ SOLN
INTRAMUSCULAR | Status: AC
  Filled 2024-04-26: qty 1

## 2024-04-26 NOTE — Anesthesia Postprocedure Evaluation (Signed)
 Anesthesia Post Note  Patient: Regina Lara  Procedure(s) Performed: COLONOSCOPY POLYPECTOMY, INTESTINE  Patient location during evaluation: Endoscopy Anesthesia Type: General Level of consciousness: awake and alert Pain management: pain level controlled Vital Signs Assessment: post-procedure vital signs reviewed and stable Respiratory status: spontaneous breathing, nonlabored ventilation, respiratory function stable and patient connected to nasal cannula oxygen Cardiovascular status: blood pressure returned to baseline and stable Postop Assessment: no apparent nausea or vomiting Anesthetic complications: no   No notable events documented.   Last Vitals:  Vitals:   04/26/24 0910 04/26/24 0920  BP: 104/61 91/78  Pulse: 68 66  Resp: 18 16  Temp:    SpO2: 100% 99%    Last Pain:  Vitals:   04/26/24 0920  TempSrc:   PainSc: 0-No pain                 Debby Mines

## 2024-04-26 NOTE — Interval H&P Note (Signed)
 History and Physical Interval Note:  04/26/2024 8:32 AM  Regina Lara  has presented today for surgery, with the diagnosis of family hx of colon.  The various methods of treatment have been discussed with the patient and family. After consideration of risks, benefits and other options for treatment, the patient has consented to  Procedure(s): COLONOSCOPY (N/A) as a surgical intervention.  The patient's history has been reviewed, patient examined, no change in status, stable for surgery.  I have reviewed the patient's chart and labs.  Questions were answered to the patient's satisfaction.     Ole ONEIDA Schick  Ok to proceed with colonoscopy

## 2024-04-26 NOTE — H&P (Signed)
 Outpatient short stay form Pre-procedure 04/26/2024  Regina ONEIDA Schick, MD  Primary Physician: Don Lauraine Collar, NP  Reason for visit:  Screening  History of present illness:    73 y/o lady with history of hypertension, hyperlipidemia, and hiatal hernia here for screening colonoscopy due to family history of polyps. Last colonoscopy in 2020 was unremarkable. No blood thinners. History of hysterectomy.    Current Facility-Administered Medications:    0.9 %  sodium chloride  infusion, , Intravenous, Continuous, Keiry Kowal, Regina ONEIDA, MD, Last Rate: 20 mL/hr at 04/26/24 0735, New Bag at 04/26/24 0735  Medications Prior to Admission  Medication Sig Dispense Refill Last Dose/Taking   atorvastatin (LIPITOR) 10 MG tablet Take 10 mg by mouth daily.   04/25/2024   diltiazem (CARDIZEM) 120 MG tablet Take 120 mg by mouth 4 (four) times daily.   04/25/2024   lisinopril (PRINIVIL,ZESTRIL) 5 MG tablet Take 5 mg by mouth daily.   04/26/2024 Morning   ASPIRIN 81 PO Take by mouth.      conjugated estrogens  (PREMARIN ) vaginal cream Estrogen Cream Instruction Discard applicator Apply pea sized amount to tip of finger to urethra before bed. Wash hands well after application. Use Monday, Wednesday and Friday 42.5 g 11    diazepam  (VALIUM ) 5 MG tablet Take 1 tablet (5 mg total) by mouth once as needed for up to 1 dose (take 30 minutes prior to cystoscopy). 1 tablet 0    ergocalciferol (VITAMIN D2) 1.25 MG (50000 UT) capsule Take 50,000 Units by mouth every 14 (fourteen) days.      esomeprazole (NEXIUM) 20 MG capsule Take 20 mg by mouth daily as needed. (Patient not taking: Reported on 07/22/2022)      omeprazole (PRILOSEC) 20 MG capsule Take 20 mg by mouth daily.      vitamin B-12 (CYANOCOBALAMIN) 500 MCG tablet Take 500 mcg by mouth daily.        Allergies  Allergen Reactions   Macrobid [Nitrofurantoin Macrocrystal]    Melatonin    Penicillins    Clindamycin/Lincomycin Rash     Past Medical History:   Diagnosis Date   Cancer (HCC)    skin   GERD (gastroesophageal reflux disease)    Hemorrhoids    History of hiatal hernia    Hyperlipidemia    Hypertension    Osteopenia    PVC (premature ventricular contraction)    Thyroid nodule    followed by Dr. Edda    Review of systems:  Otherwise negative.    Physical Exam  Gen: Alert, oriented. Appears stated age.  HEENT: PERRLA. Lungs: No respiratory distress CV: RRR Abd: soft, benign, no masses Ext: No edema    Planned procedures: Proceed with colonoscopy. The patient understands the nature of the planned procedure, indications, risks, alternatives and potential complications including but not limited to bleeding, infection, perforation, damage to internal organs and possible oversedation/side effects from anesthesia. The patient agrees and gives consent to proceed.  Please refer to procedure notes for findings, recommendations and patient disposition/instructions.     Regina ONEIDA Schick, MD [facility]

## 2024-04-26 NOTE — Transfer of Care (Signed)
 Immediate Anesthesia Transfer of Care Note  Patient: RIA REDCAY  Procedure(s) Performed: COLONOSCOPY POLYPECTOMY, INTESTINE  Patient Location: PACU  Anesthesia Type:General  Level of Consciousness: sedated  Airway & Oxygen Therapy: Patient Spontanous Breathing  Post-op Assessment: Report given to RN and Post -op Vital signs reviewed and stable  Post vital signs: Reviewed and stable  Last Vitals:  Vitals Value Taken Time  BP 91/77 04/26/24 09:01  Temp 36.1 C 04/26/24 09:00  Pulse 61 04/26/24 09:01  Resp 18 04/26/24 09:01  SpO2 99 % 04/26/24 09:01  Vitals shown include unfiled device data.  Last Pain:  Vitals:   04/26/24 0900  TempSrc: Temporal  PainSc: 0-No pain         Complications: No notable events documented.

## 2024-04-26 NOTE — Anesthesia Preprocedure Evaluation (Signed)
 Anesthesia Evaluation  Patient identified by MRN, date of birth, ID band Patient awake    Reviewed: Allergy & Precautions, NPO status , Patient's Chart, lab work & pertinent test results, reviewed documented beta blocker date and time   Airway Mallampati: II  TM Distance: >3 FB Neck ROM: full    Dental  (+) Chipped   Pulmonary neg pulmonary ROS, former smoker   Pulmonary exam normal        Cardiovascular hypertension, Pt. on medications (-) angina +CHF  negative cardio ROS Normal cardiovascular exam Rate:Bradycardia     Neuro/Psych negative neurological ROS  negative psych ROS   GI/Hepatic negative GI ROS, Neg liver ROS, hiatal hernia,GERD  ,,  Endo/Other  negative endocrine ROS    Renal/GU negative Renal ROS  negative genitourinary   Musculoskeletal   Abdominal   Peds  Hematology negative hematology ROS (+)   Anesthesia Other Findings Patient saw cardiology for PVCs last month. Patient wore holter monitor and is waiting to meet again with cardiology later this month. Patient states the PVCs are very infrequent and dont affect her daily life. Denies any kind of shortness of breathe, loss of consciousness, or chest pain. Will proceed today but explained that the case might be canceled if it she is seen to have an increase in PVCs or if she becomes unstable. Patient understands and willing to proceed.   Past Medical History: No date: Cancer (HCC)     Comment:  skin No date: GERD (gastroesophageal reflux disease) No date: Hemorrhoids No date: History of hiatal hernia No date: Hyperlipidemia No date: Hypertension No date: Osteopenia No date: PVC (premature ventricular contraction) No date: Thyroid nodule     Comment:  followed by Dr. Edda  Past Surgical History: No date: ABDOMINAL HYSTERECTOMY 1980's: CERVICAL BIOPSY 08/29/2008, 10/28/2013: COLONOSCOPY 04/11/2019: COLONOSCOPY WITH PROPOFOL ; N/A     Comment:   Procedure: COLONOSCOPY WITH PROPOFOL ;  Surgeon:               Gaylyn Gladis PENNER, MD;  Location: [facility] ENDOSCOPY;                Service: Endoscopy;  Laterality: N/A; No date: DILATION AND CURETTAGE OF UTERUS No date: ESOPHAGOGASTRODUODENOSCOPY 07/22/2022: ESOPHAGOGASTRODUODENOSCOPY (EGD) WITH PROPOFOL ; N/A     Comment:  Procedure: ESOPHAGOGASTRODUODENOSCOPY (EGD) WITH               PROPOFOL ;  Surgeon: Maryruth Ole DASEN, MD;  Location:               [facility] ENDOSCOPY;  Service: Endoscopy;  Laterality: N/A; No date: SKIN CANCER EXCISION  BMI    Body Mass Index: 28.53 kg/m      Reproductive/Obstetrics negative OB ROS                              Anesthesia Physical Anesthesia Plan  ASA: 3  Anesthesia Plan: General   Post-op Pain Management: Minimal or no pain anticipated   Induction: Intravenous  PONV Risk Score and Plan: 2 and Propofol  infusion and TIVA  Airway Management Planned: Nasal Cannula  Additional Equipment: None  Intra-op Plan:   Post-operative Plan:   Informed Consent: I have reviewed the patients History and Physical, chart, labs and discussed the procedure including the risks, benefits and alternatives for the proposed anesthesia with the patient or authorized representative who has indicated his/her understanding and acceptance.     Dental advisory given  Plan Discussed  with: CRNA and Surgeon  Anesthesia Plan Comments: (Discussed risks of anesthesia with patient, including possibility of difficulty with spontaneous ventilation under anesthesia necessitating airway intervention, PONV, and rare risks such as cardiac or respiratory or neurological events, and allergic reactions. Discussed the role of CRNA in patient's perioperative care. Patient understands.)        Anesthesia Quick Evaluation

## 2024-04-26 NOTE — Op Note (Signed)
 [facility] Gastroenterology Patient Name: Regina Lara Procedure Date: 04/26/2024 8:34 AM MRN: [id number] Account #: [id number] Date of Birth: April 30, 1951 Admit Type: Outpatient Age: 73 Room: [facility] ENDO ROOM 1 Gender: Female Note Status: Finalized Instrument Name: Colon Scope [id number] Procedure:             Colonoscopy Indications:           Colon cancer screening in patient at increased risk:                         Family history of 1st-degree relative with colon polyps Providers:             Ole Schick MD, MD Referring MD:          Lauraine LOIS Leak (Referring MD) Medicines:             Monitored Anesthesia Care Complications:         No immediate complications. Estimated blood loss:                         Minimal. Procedure:             Pre-Anesthesia Assessment:                        - Prior to the procedure, a History and Physical was                         performed, and patient medications and allergies were                         reviewed. The patient is competent. The risks and                         benefits of the procedure and the sedation options and                         risks were discussed with the patient. All questions                         were answered and informed consent was obtained.                         Patient identification and proposed procedure were                         verified by the physician, the nurse, the                         anesthesiologist, the anesthetist and the technician                         in the endoscopy suite. Mental Status Examination:                         alert and oriented. Airway Examination: normal                         oropharyngeal airway and neck mobility. Respiratory  Examination: clear to auscultation. CV Examination:                         normal. Prophylactic Antibiotics: The patient does not                         require prophylactic antibiotics.  Prior                         Anticoagulants: The patient has taken no anticoagulant                         or antiplatelet agents. ASA Grade Assessment: III - A                         patient with severe systemic disease. After reviewing                         the risks and benefits, the patient was deemed in                         satisfactory condition to undergo the procedure. The                         anesthesia plan was to use monitored anesthesia care                         (MAC). Immediately prior to administration of                         medications, the patient was re-assessed for adequacy                         to receive sedatives. The heart rate, respiratory                         rate, oxygen saturations, blood pressure, adequacy of                         pulmonary ventilation, and response to care were                         monitored throughout the procedure. The physical                         status of the patient was re-assessed after the                         procedure.                        After obtaining informed consent, the colonoscope was                         passed under direct vision. Throughout the procedure,                         the patient's blood pressure, pulse, and oxygen  saturations were monitored continuously. The                         Colonoscope was introduced through the anus and                         advanced to the the terminal ileum, with                         identification of the appendiceal orifice and IC                         valve. The colonoscopy was performed without                         difficulty. The patient tolerated the procedure well.                         The quality of the bowel preparation was good. The                         terminal ileum, ileocecal valve, appendiceal orifice,                         and rectum were photographed. Findings:      The perianal and digital  rectal examinations were normal.      The terminal ileum appeared normal.      A single medium-sized localized angioectasia without bleeding was found       in the ascending colon.      A 3 mm polyp was found in the transverse colon. The polyp was       hyperplastic. The polyp was removed with a cold snare. Resection and       retrieval were complete. Estimated blood loss was minimal.      The exam was otherwise without abnormality on direct and retroflexion       views. Impression:            - The examined portion of the ileum was normal.                        - A single non-bleeding colonic angioectasia.                        - One 3 mm polyp in the transverse colon, removed with                         a cold snare. Resected and retrieved.                        - The examination was otherwise normal on direct and                         retroflexion views. Recommendation:        - Discharge patient to home.                        - Resume previous diet.                        -  Continue present medications.                        - Await pathology results.                        - Repeat colonoscopy is not recommended due to current                         age (46 years or older) for surveillance.                        - Return to referring physician as previously                         scheduled. Procedure Code(s):     --- Professional ---                        [phone], Colonoscopy, flexible; with removal of                         tumor(s), polyp(s), or other lesion(s) by snare                         technique Diagnosis Code(s):     --- Professional ---                        Z83.71, Family history of colonic polyps                        K55.20, Angiodysplasia of colon without hemorrhage                        D12.3, Benign neoplasm of transverse colon (hepatic                         flexure or splenic flexure) CPT copyright 2022 [employer]. All rights  reserved. The codes documented in this report are preliminary and upon coder review may  be revised to meet current compliance requirements. Ole Schick MD, MD 04/26/2024 9:00:16 AM Number of Addenda: 0 Note Initiated On: 04/26/2024 8:34 AM Estimated Blood Loss:  Estimated blood loss was minimal.      [facility]

## 2024-04-28 LAB — SURGICAL PATHOLOGY

## 2024-07-31 ENCOUNTER — Ambulatory Visit
Admission: EM | Admit: 2024-07-31 | Discharge: 2024-07-31 | Disposition: A | Discharge status: Home / Self Care | Attending: Family Medicine | Admitting: Family Medicine

## 2024-07-31 DIAGNOSIS — N3001 Acute cystitis with hematuria: Secondary | ICD-10-CM | POA: Insufficient documentation

## 2024-07-31 LAB — POCT URINE DIPSTICK
Bilirubin, UA: NEGATIVE
Glucose, UA: NEGATIVE mg/dL
Ketones, POC UA: NEGATIVE mg/dL
Nitrite, UA: NEGATIVE
Protein Ur, POC: 100 mg/dL — AB
Spec Grav, UA: 1.025
Urobilinogen, UA: 0.2 U/dL
pH, UA: 6

## 2024-07-31 MED ORDER — CIPROFLOXACIN HCL 500 MG PO TABS: 500.0000 mg | ORAL_TABLET | Freq: Two times a day (BID) | ORAL | 0 refills | Status: AC

## 2024-07-31 MED ORDER — FLUCONAZOLE 150 MG PO TABS: 150.0000 mg | ORAL_TABLET | Freq: Once | ORAL | 0 refills | Status: AC

## 2024-07-31 NOTE — Discharge Instructions (Addendum)
 You have a urinary tract infection. I sent your urine for culture to be sure the antibiotic prescribed will treat your infection. Someone may call you to change antibiotics. Stop by the pharmacy to pick up your prescriptions.  Follow up with your primary care provider or return to the urgent care, if not improving.   Take your diflucan  on the last day of your antibiotics.

## 2024-07-31 NOTE — ED Triage Notes (Signed)
 Sx started yesterday  Dysuria  Urinary frequency Blood in urine  Hasn't taken anything

## 2024-07-31 NOTE — ED Provider Notes (Signed)
 " [facility]    CSN: [id number] Arrival date & time: 07/31/24  9178      History   Chief Complaint Chief Complaint  Patient presents with   Dysuria     HPI HPI Regina Lara is a 74 y.o. female.    Regina Lara presents for frequency urination, dysuria that started yesterday.  This morning, she has some blood with urination .  Tried nothing for symptoms prior to arrival.  Has  not had any antibiotics in last 30 days.  No LMP recorded. Patient has had a hysterectomy.    - Abnormal vaginal discharge: np - vaginal odor: no - vaginal bleeding: no - Dysuria: yes  - Hematuria: yes  - Urinary urgency:no  - Urinary frequency: yes   - Fever: no - Abdominal pain: no  - Pelvic pain: no - Rash/Skin lesions/mouth ulcers: no - Nausea: no  - Vomiting: no  - Back Pain: no        Past Medical History:  Diagnosis Date   Cancer (HCC)    skin   GERD (gastroesophageal reflux disease)    Hemorrhoids    History of hiatal hernia    Hyperlipidemia    Hypertension    Osteopenia    PVC (premature ventricular contraction)    Thyroid nodule    followed by Dr. Juengel    There are no active problems to display for this patient.   Past Surgical History:  Procedure Laterality Date   ABDOMINAL HYSTERECTOMY     CERVICAL BIOPSY  1980's   COLONOSCOPY  08/29/2008, 10/28/2013   COLONOSCOPY N/A 04/26/2024   Procedure: COLONOSCOPY;  Surgeon: Maryruth Ole DASEN, MD;  Location: [facility] ENDOSCOPY;  Service: Endoscopy;  Laterality: N/A;   COLONOSCOPY WITH PROPOFOL  N/A 04/11/2019   Procedure: COLONOSCOPY WITH PROPOFOL ;  Surgeon: Gaylyn Gladis PENNER, MD;  Location: [facility] ENDOSCOPY;  Service: Endoscopy;  Laterality: N/A;   DILATION AND CURETTAGE OF UTERUS     ESOPHAGOGASTRODUODENOSCOPY     ESOPHAGOGASTRODUODENOSCOPY (EGD) WITH PROPOFOL  N/A 07/22/2022   Procedure: ESOPHAGOGASTRODUODENOSCOPY (EGD) WITH PROPOFOL ;  Surgeon: Maryruth Ole DASEN, MD;  Location: [facility] ENDOSCOPY;  Service:  Endoscopy;  Laterality: N/A;   POLYPECTOMY  04/26/2024   Procedure: POLYPECTOMY, INTESTINE;  Surgeon: Maryruth Ole DASEN, MD;  Location: [facility] ENDOSCOPY;  Service: Endoscopy;;   SKIN CANCER EXCISION      OB History   No obstetric history on file.      Home Medications    Prior to Admission medications  Medication Sig Start Date End Date Taking? Authorizing Provider  atorvastatin (LIPITOR) 10 MG tablet Take 10 mg by mouth daily.   Yes [provider]  ciprofloxacin  (CIPRO ) 500 MG tablet Take 1 tablet (500 mg total) by mouth every 12 (twelve) hours. 07/31/24  Yes Christe Tellez, DO  diltiazem (CARDIZEM) 120 MG tablet Take 120 mg by mouth 4 (four) times daily.   Yes [provider]  fluconazole  (DIFLUCAN ) 150 MG tablet Take 1 tablet (150 mg total) by mouth once for 1 dose. 07/31/24 07/31/24 Yes Georgie Haque, DO  lisinopril (PRINIVIL,ZESTRIL) 5 MG tablet Take 5 mg by mouth daily.   Yes [provider]  vitamin B-12 (CYANOCOBALAMIN) 500 MCG tablet Take 500 mcg by mouth daily.   Yes [provider]  ASPIRIN 81 PO Take by mouth.    [provider]  conjugated estrogens  (PREMARIN ) vaginal cream Estrogen Cream Instruction Discard applicator Apply pea sized amount to tip of finger to urethra before bed. Wash  hands well after application. Use Monday, Wednesday and Friday 09/26/20   Francisca Redell BROCKS, MD  diazepam  (VALIUM ) 5 MG tablet Take 1 tablet (5 mg total) by mouth once as needed for up to 1 dose (take 30 minutes prior to cystoscopy). 09/04/20   Francisca Redell BROCKS, MD  ergocalciferol (VITAMIN D2) 1.25 MG (50000 UT) capsule Take 50,000 Units by mouth every 14 (fourteen) days.    [provider]  esomeprazole (NEXIUM) 20 MG capsule Take 20 mg by mouth daily as needed. Patient not taking: Reported on 07/22/2022    [provider]  omeprazole (PRILOSEC) 20 MG capsule Take 20 mg by mouth daily.    [provider]    Family  History History reviewed. No pertinent family history.  Social History Social History[1]   Allergies   Macrobid [nitrofurantoin macrocrystal], Melatonin, Penicillins, and Clindamycin/lincomycin   Review of Systems Review of Systems: :negative unless otherwise stated in HPI.      Physical Exam Triage Vital Signs ED Triage Vitals  Encounter Vitals Group     BP 07/31/24 0835 (!) 155/96     Girls Systolic BP Percentile --      Girls Diastolic BP Percentile --      Boys Systolic BP Percentile --      Boys Diastolic BP Percentile --      Pulse Rate 07/31/24 0835 76     Resp 07/31/24 0835 18     Temp 07/31/24 0835 98.6 F (37 C)     Temp Source 07/31/24 0835 Oral     SpO2 07/31/24 0835 99 %     Weight 07/31/24 0834 153 lb (69.4 kg)     Height --      Head Circumference --      Peak Flow --      Pain Score 07/31/24 0834 7     Pain Loc --      Pain Education --      Exclude from Growth Chart --    No data found.  Updated Vital Signs BP (!) 155/96 (BP Location: Left Arm)   Pulse 76   Temp 98.6 F (37 C) (Oral)   Resp 18   Wt 69.4 kg   SpO2 99%   BMI 28.91 kg/m   Visual Acuity Right Eye Distance:   Left Eye Distance:   Bilateral Distance:    Right Eye Near:   Left Eye Near:    Bilateral Near:     Physical Exam GEN: well appearing female in no acute distress  CVS: well perfused  RESP: speaking in full sentences without pause  ABD: soft, non-tender, non-distended, no palpable masses, no CVA tenderness      UC Treatments / Results  Labs (all labs ordered are listed, but only abnormal results are displayed) Labs Reviewed  POCT URINE DIPSTICK - Abnormal; Notable for the following components:      Result Value   Color, UA straw (*)    Clarity, UA cloudy (*)    Blood, UA large (*)    Protein Ur, POC =100 (*)    Leukocytes, UA Small (1+) (*)    All other components within normal limits  URINE CULTURE    EKG   Radiology No results  found.  Procedures Procedures (including critical care time)  Medications Ordered in UC Medications - No data to display  Initial Impression / Assessment and Plan / UC Course  I have reviewed the triage vital signs and the nursing notes.  Pertinent labs & imaging results that were available during my care of the patient were reviewed by me and considered in my medical decision making (see chart for details).       Acute cystitis:  Patient is a 74 y.o. female  who presents for 1 da of dysuria and urinary frequency.  Overall patient is well-appearing and afebrile.  Vital signs stable.   POC urine dipstick consistent with acute cystitis.   Hematuria  present but no  microscopy available.   Treat with Cipro  2 times daily for 5 days.  Urine culture obtained.  Follow-up sensitivities and change antibiotics, if needed. Diflucan  for antibiotic associated yeast infection prevention.   Return precautions including abdominal pain, fever, chills, nausea, or vomiting given. Follow-up,  if symptoms not improving or getting worse. Discussed MDM, treatment plan and plan for follow-up with patient who agrees with plan.        Final Clinical Impressions(s) / UC Diagnoses   Final diagnoses:  Acute cystitis with hematuria     Discharge Instructions      You have a urinary tract infection. I sent your urine for culture to be sure the antibiotic prescribed will treat your infection. Someone may call you to change antibiotics. Stop by the pharmacy to pick up your prescriptions.  Follow up with your primary care provider or return to the urgent care, if not improving.   Take your diflucan  on the last day of your antibiotics.      ED Prescriptions     Medication Sig Dispense Auth. Provider   ciprofloxacin  (CIPRO ) 500 MG tablet Take 1 tablet (500 mg total) by mouth every 12 (twelve) hours. 10 tablet Anjanae Woehrle, DO   fluconazole  (DIFLUCAN ) 150 MG tablet Take 1 tablet (150 mg total) by  mouth once for 1 dose. 1 tablet Kriste Berth, DO      PDMP not reviewed this encounter.    [1]  Social History Tobacco Use   Smoking status: Former    Current packs/day: 0.00    Types: Cigarettes    Quit date: 06/23/2005    Years since quitting: 19.1   Smokeless tobacco: Never  Vaping Use   Vaping status: Never Used  Substance Use Topics   Alcohol use: No   Drug use: No     Kriste Berth, DO 07/31/24 0907  "

## 2024-08-02 ENCOUNTER — Ambulatory Visit (HOSPITAL_COMMUNITY): Payer: Self-pay

## 2024-08-02 LAB — URINE CULTURE: Culture: 30000 — AB
